# Patient Record
Sex: Female | Born: 1980 | Hispanic: Yes | Marital: Married | Smoking: Never smoker
Health system: Southern US, Community
[De-identification: ages and names within clinical notes are randomized; demographics above are authoritative.]

## PROBLEM LIST (undated history)

## (undated) ENCOUNTER — Inpatient Hospital Stay (HOSPITAL_COMMUNITY): Payer: Self-pay

## (undated) DIAGNOSIS — O1490 Unspecified pre-eclampsia, unspecified trimester: Secondary | ICD-10-CM

## (undated) DIAGNOSIS — F32A Depression, unspecified: Secondary | ICD-10-CM

## (undated) DIAGNOSIS — O159 Eclampsia, unspecified as to time period: Secondary | ICD-10-CM

## (undated) DIAGNOSIS — F419 Anxiety disorder, unspecified: Secondary | ICD-10-CM

## (undated) DIAGNOSIS — F329 Major depressive disorder, single episode, unspecified: Secondary | ICD-10-CM

## (undated) DIAGNOSIS — R569 Unspecified convulsions: Secondary | ICD-10-CM

---

## 2005-02-14 ENCOUNTER — Inpatient Hospital Stay (HOSPITAL_COMMUNITY): Admission: AD | Admit: 2005-02-14 | Discharge: 2005-02-14 | Payer: Self-pay | Admitting: *Deleted

## 2005-02-21 ENCOUNTER — Ambulatory Visit: Payer: Self-pay | Admitting: Family Medicine

## 2005-02-28 ENCOUNTER — Ambulatory Visit: Payer: Self-pay | Admitting: Family Medicine

## 2005-04-01 ENCOUNTER — Ambulatory Visit: Payer: Self-pay | Admitting: Family Medicine

## 2005-04-11 ENCOUNTER — Ambulatory Visit (HOSPITAL_COMMUNITY): Admission: RE | Admit: 2005-04-11 | Discharge: 2005-04-11 | Payer: Self-pay | Admitting: Obstetrics & Gynecology

## 2005-04-30 ENCOUNTER — Ambulatory Visit: Payer: Self-pay | Admitting: Family Medicine

## 2005-06-06 ENCOUNTER — Ambulatory Visit: Payer: Self-pay | Admitting: Family Medicine

## 2005-06-10 ENCOUNTER — Ambulatory Visit: Payer: Self-pay | Admitting: Family Medicine

## 2005-07-09 ENCOUNTER — Ambulatory Visit: Payer: Self-pay | Admitting: Family Medicine

## 2005-07-17 ENCOUNTER — Ambulatory Visit: Payer: Self-pay | Admitting: *Deleted

## 2005-07-17 ENCOUNTER — Inpatient Hospital Stay (HOSPITAL_COMMUNITY): Admission: AD | Admit: 2005-07-17 | Discharge: 2005-07-20 | Payer: Self-pay | Admitting: Obstetrics and Gynecology

## 2005-07-21 ENCOUNTER — Inpatient Hospital Stay (HOSPITAL_COMMUNITY): Admission: AD | Admit: 2005-07-21 | Discharge: 2005-07-21 | Payer: Self-pay | Admitting: Obstetrics & Gynecology

## 2005-07-21 ENCOUNTER — Ambulatory Visit: Payer: Self-pay | Admitting: *Deleted

## 2005-07-23 ENCOUNTER — Inpatient Hospital Stay (HOSPITAL_COMMUNITY): Admission: AD | Admit: 2005-07-23 | Discharge: 2005-07-28 | Payer: Self-pay | Admitting: *Deleted

## 2005-07-23 ENCOUNTER — Ambulatory Visit: Payer: Self-pay | Admitting: Obstetrics and Gynecology

## 2005-07-23 ENCOUNTER — Encounter (INDEPENDENT_AMBULATORY_CARE_PROVIDER_SITE_OTHER): Payer: Self-pay | Admitting: Specialist

## 2005-09-17 ENCOUNTER — Ambulatory Visit: Payer: Self-pay | Admitting: Family Medicine

## 2006-09-17 DIAGNOSIS — G43909 Migraine, unspecified, not intractable, without status migrainosus: Secondary | ICD-10-CM | POA: Insufficient documentation

## 2010-07-22 ENCOUNTER — Inpatient Hospital Stay (HOSPITAL_COMMUNITY)
Admission: AD | Admit: 2010-07-22 | Discharge: 2010-07-22 | Payer: Self-pay | Source: Home / Self Care | Attending: Obstetrics & Gynecology | Admitting: Obstetrics & Gynecology

## 2010-09-06 ENCOUNTER — Inpatient Hospital Stay (HOSPITAL_COMMUNITY)
Admission: AD | Admit: 2010-09-06 | Discharge: 2010-09-06 | Disposition: A | Discharge status: Home / Self Care | Payer: Self-pay | Source: Ambulatory Visit | Attending: Obstetrics & Gynecology | Admitting: Obstetrics & Gynecology

## 2010-09-06 DIAGNOSIS — R109 Unspecified abdominal pain: Secondary | ICD-10-CM | POA: Insufficient documentation

## 2010-09-06 DIAGNOSIS — O200 Threatened abortion: Secondary | ICD-10-CM

## 2010-09-19 DEATH — deceased

## 2010-09-30 LAB — URINE MICROSCOPIC-ADD ON

## 2010-09-30 LAB — URINALYSIS, ROUTINE W REFLEX MICROSCOPIC
Bilirubin Urine: NEGATIVE
Glucose, UA: NEGATIVE mg/dL
Ketones, ur: NEGATIVE mg/dL
Nitrite: POSITIVE — AB
Protein, ur: NEGATIVE mg/dL
Specific Gravity, Urine: 1.01 (ref 1.005–1.030)
Urobilinogen, UA: 0.2 mg/dL (ref 0.0–1.0)
pH: 7 (ref 5.0–8.0)

## 2010-09-30 LAB — GC/CHLAMYDIA PROBE AMP, GENITAL
Chlamydia, DNA Probe: NEGATIVE
GC Probe Amp, Genital: NEGATIVE

## 2010-09-30 LAB — URINE CULTURE
Colony Count: >=100000
Culture  Setup Time: 201201030136

## 2010-09-30 LAB — WET PREP, GENITAL
Trich, Wet Prep: NONE SEEN
Yeast Wet Prep HPF POC: NONE SEEN

## 2010-09-30 LAB — CBC
HCT: 36.3 % (ref 36.0–46.0)
Hemoglobin: 12.9 g/dL (ref 12.0–15.0)
MCH: 31.2 pg (ref 26.0–34.0)
MCHC: 35.5 g/dL (ref 30.0–36.0)
MCV: 87.7 fL (ref 78.0–100.0)
Platelets: 292 10*3/uL (ref 150–400)
RBC: 4.14 MIL/uL (ref 3.87–5.11)
RDW: 12.7 % (ref 11.5–15.5)
WBC: 7.2 10*3/uL (ref 4.0–10.5)

## 2010-09-30 LAB — ABO/RH: ABO/RH(D): O POS

## 2010-09-30 LAB — POCT PREGNANCY, URINE: Preg Test, Ur: POSITIVE

## 2010-12-06 NOTE — Discharge Summary (Signed)
Dorothy Vaughan           ACCOUNT NO.:  [id number]   MEDICAL RECORD NO.:  [id number]          PATIENT TYPE:  INP   LOCATION:  9151                          [facility]ILITY:  [facility]   PHYSICIAN:  Tanya S. Shawnie Vaughan, M.D.   DATE OF BIRTH:  1981-02-27   DATE OF ADMISSION:  07/17/2005  DATE OF DISCHARGE:  07/20/2005                                 DISCHARGE SUMMARY   REASON FOR ADMISSION:  Intrauterine pregnancy at 33-1/7 weeks without  elevated blood pressure, rule out preeclampsia, possible preterm labor and  elevated temperature, and abdominal pain.   DISCHARGE DIAGNOSES:  1.  Intrauterine pregnancy at 33 weeks.  2.  Abdominal pain, resolved.  3.  Elevated liver function tests, resolved.  4.  Oligohydramnios with an AFI of 8.2.  5.  Elevated blood pressures with a normal 24-hour urine (possible evolving      preeclampsia).  6.  Preterm contractions.   IMAGING:  On July 17, 2005, OB ultrasound showed a cephalic infant,  grade 1 placenta, AFI 10.7, estimated fetal weight 1785 (25th to the 50th  percentile).  Abdominal circumference 30 weeks 4 days.  Femur length 33 and  4 days.  Head circumference 30 weeks 4 days.  Bipyramidal diameter 30 weeks  3 days.  Cervix 2.8 cm.  Umbilical artery Dopplers 2.14 (normal less than  3.7).   On December 28, BPP 8 out of 8.  Normal umbilical artery Dopplers.  AFI 8.2  which is the low end of normal.   Gallbladder ultrasound:  A 3 mm gallstone adherent to the wall versus  gallbladder polyp.  No evidence of acute cholecystitis.  Small right pleural  effusion.   LABORATORY DATA:  Admit labs:  AFP 118 which decreased at 39.  ALT 77 which  decreased to 36.  Otherwise PH 5 within normal limits.  A 24-hour urine  showed protein at 168, volume 2100, creatinine clearance 113.  Amylase and  lipase negative.   HOSPITAL COURSE:  The patient is a 30 year old gravida 1, para 0 at 33-1/7  weeks dated by an LMP consistent with one-week ultrasound.  The  patient  presented with contractions since midnight.  She also endorsed a mild  headache but denied visual changes, shortness of breath. She reported  constant abdominal pain.  On initial exam her blood pressures were found to  be elevated, ranging from 153-180/66-92.  Abdomen was diffusely tender to  palpation with no rebound.  She was found to be contracting approximately  every minute.  Sterile vaginal exam revealed that she was dilated to 1, 80,  -3 station.  She was admitted.  Ultrasound was done and showed no signs of  an abruption.  She did receive one dose of terbutaline and was started on  Procardia preterm labor.  __________  were within normal limits except for  elevated LFTs and she also had an elevated LDH at 315.  A 24-hour urine was  started.  Results are as above.  The patient was also given two doses of  betamethasone.  During her hospital course her blood pressures were within  normal limits, except for  some isolated blood pressures that were up to  150/87.  She was not in preterm labor and her tocolytics were stopped.  The  abdominal pain did completely resolve during the hospitalization.  Further  investigation was done with the gallbladder ultrasound which was described  above.  It was decided that the patient was stable for discharge to home.   DISPOSITION:  Home.   FOLLOW UP:  July 21, 2004 for biophysical profile and to return a 24-hour  urine.  She will also get PIH labs drawn that day.  Then she will be  followed in the high-risk clinic on Thursday, July 24, 2004.  Plan is to  continue fetal testing twice weekly with PIH labs weekly.   DISCHARGE MEDICATIONS:  Prenatal vitamins.   DIET:  Regular.   ACTIVITY:  Bed rest.   DISCHARGE INSTRUCTIONS:  Preterm labor precautions, preeclampsia precautions  and __________  counts.  It was explained to the patient.     ______________________________  Dorothy Vaughan, M.D.     ______________________________  Dorothy Vaughan, M.D.    TLW/MEDQ  D:  07/20/2005  T:  07/21/2005  Job:  [id number]

## 2010-12-06 NOTE — Discharge Summary (Signed)
NAMEANNALYSSA, Dorothy Vaughan           ACCOUNT NO.:  [id number]   MEDICAL RECORD NO.:  [id number]          PATIENT TYPE:  INP   LOCATION:  9320                          [facility]:  [facility]   PHYSICIAN:  Lesly Dukes, M.D. DATE OF BIRTH:  1980/08/28   DATE OF ADMISSION:  07/23/2005  DATE OF DISCHARGE:  07/28/2005                                 DISCHARGE SUMMARY   CHIEF COMPLAINT:  Eclamptic seizure with delivery of 34 week infant.   ATTENDING PHYSICIAN:  Dr. Penne Lash.   PRIMARY CARE PHYSICIAN:  Dr. Penni Bombard.   DISCHARGE DIAGNOSES:  1.  Eclampsia.  2.  Posterior reversible encephalopathy syndrome.  3.  Status post lateral transverse C-section of approximately a 83 week female      infant.  4.  Anemia.   PROCEDURES:  MRI revealed bilateral perioccipital cortical and subcortical  hyper intensity, which was mildly positive on diffusion.  This was  consistent with PRES syndrome.  Also, showed enlarged pituitary consistent  with peripartum state and a left tongue laceration likely secondary to  seizure.  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   LABORATORY VALUES:  At time of discharge, hemoglobin 8.8.  Uric acid 4.5,  LDH 277.  AST 98, ALT 67, alkaline phos 210, T-bili 0.3, creatinine 0.6.  Please note those are on the day prior to discharge.   HOSPITAL COURSE:  1.  Eclampsia.  The patient had been followed for one week prior to      admission for preeclampsia.  She presented to the Maternity Admissions      with complaints of scotomata and severe headache.  She could also, at      time of admission, not see below her nose and mouth.  While PIH labs      were pending, the patient had an eclamptic seizure and at that time was      taken immediately for cesarean section.  A lower transverse C-section      was performed and she delivered a viable female at 44 and 5/7 weeks.  At      the time of admission, she was started on magnesium, and continued on  magnesium for over 48 hours postpartum.  Her PIH labs were monitored      including LFT and platelets.  These all remained normal until the two      days prior to discharge where she had a small bump in her LFTs, which      were trending down at the time of discharge.  Her LDH and uric acid were      within the normal range at time of discharge.  She had no further      eclamptic seizures and her magnesium was discharged on postpartum day      #3.  2.  PRES syndrome.  Postpartum, the patient was found to have a visual field      loss in the bilateral lower fields.  She had evaluation by      Ophthalmology, who recommended MRI scan.  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  Over the      remainder days of her admission, her loss of vision resolved and was      back to baseline at time of discharge.  3.  Elevated blood pressure was consistent with eclampsia. She was      maintained on Labetalol throughout her admission, and at time of      discharge was requiring only minimal amounts of Labetalol, and therefore      was discharged on 12.5 of metoprolol daily.  She will be followed up in      two week's time in the office, and likely will no longer need this      medication.  4.  Breastfeeding.  The patient started pumping during admission and has      been doing well producing milk for her child.  5.  Viable female infant, has been well in the NICU despite one mishap with      placing an NG tube, in which the NG tube coiled in the nasopharynx, and      went up into the eustachian tubes into the ossicles of the ears.  This      will need to be followed two weeks post discharge.  6.  Contraception.  The patient will be considering possible IUD placement,      but in the meantime will continue using rhythm method.   CONSULTS:  Dr. Sandria Manly with Neurology, and  Ophthalmology.   DISCHARGE MEDICATIONS:  1.  Percocet 5/325 one to two tabs p.o. q.4 to 6 h. p.r.n. #30.  2.  Ibuprofen 800 mg one tab p.o. q.8 h. p.r.n. #30.  3.  Prenatal vitamins.      Penni Bombard, MD    ______________________________  Lesly Dukes, M.D.    SJ/MEDQ  D:  07/28/2005  T:  07/28/2005  Job:  [id number]   cc:   Penni Bombard, MD  Fax: [phone]

## 2010-12-06 NOTE — Consult Note (Signed)
NAMEMATSUE, STROM           ACCOUNT NO.:  [id number]   MEDICAL RECORD NO.:  [id number]          PATIENT TYPE:  INP   LOCATION:  9374                          [facility]   PHYSICIAN:  Genene Churn. Love, M.D.    DATE OF BIRTH:  1980/11/18   DATE OF CONSULTATION:  07/23/2005  DATE OF DISCHARGE:                                   CONSULTATION   HISTORY OF PRESENT ILLNESS:  This 30 year old right-handed [place] female  who is in her 34th week of gestation is seen on an emergency basis at the  request of Penni Bombard, M.D. for evaluation of headache, seizure, and  visual loss.   Ms. Zeiter has no known prior history of hypertension.  She is currently  [redacted] weeks pregnant and yesterday developed headache with blurred vision  characterized by dots in her vision and today had a seizure.  This was  followed by cesarean section.  She was eclamptic.  She has had headaches and  visual disturbance today.  She was seen by ophthalmology who noted bilateral  lower visual field cuts and recommended neurologic evaluation.  She has  received magnesium with magnesium levels in the 6 range and labetalol.  Blood pressure has been as high as 105 diastolic.   PHYSICAL EXAMINATION:  GENERAL:  Well-developed female in no acute distress.  She was not complaining of headache.  VITAL SIGNS:  Blood pressure right arm was 120/80, left arm 140/80, heart  rate 64.  There were no bruits.  MENTAL STATUS:  She was alert and oriented x3.  Her cranial nerve  examination revealed visual fields to be full except for possibly some  difficulty with vision in the lower fields bilaterally, although, this was  not to count finger examination.  She noted obscuration in the lower fields  bilaterally.  She had full extraocular movements, pupils that reacted from 4  to 3 bilaterally, corneals that were present, hearing intact.  Tongue  midline, uvula midline, gag is present.  Motor examination revealed 5/5  strength upper  and lower extremities without full effort given in the lower  extremities.  Sensory examination intact.  Deep tendon reflexes 2+ and  plantar response is downgoing.   IMPRESSION:  1.  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  784.0 and 368.4  2.  Hypertension.  796.2   PLAN:  I agree with MRI study to look for evidence of Press syndrome in the  posterior circulation distribution.           ______________________________  Genene Churn. Sandria Manly, M.D.     JML/MEDQ  D:  07/23/2005  T:  07/24/2005  Job:  [id number]

## 2010-12-06 NOTE — Op Note (Signed)
Dorothy Vaughan, Dorothy Vaughan           ACCOUNT NO.:  [id number]   MEDICAL RECORD NO.:  [id number]          PATIENT TYPE:  INP   LOCATION:  9374                          [facility]   PHYSICIAN:  Conni Elliot, M.D.DATE OF BIRTH:  [birth year]   DATE OF PROCEDURE:  07/23/2005  DATE OF DISCHARGE:                                 OPERATIVE REPORT   PREOPERATIVE DIAGNOSES:  1.  Eclampsia.  2.  Placental abruption.   POSTOPERATIVE DIAGNOSES:  1.  Eclampsia.  2.  Placental abruption.   OPERATION:  STAT low transverse cesarean delivery.   OPERATOR:  Conni Elliot, M.D.   ANESTHESIA:  General endotracheal.   DESCRIPTION OF PROCEDURE:  After placing the patient under general  endotracheal anesthesia, with patient supine, the abdomen was prepped and  draped in a sterile fashion.  An emergent Pfannenstiel incision was made.  The remainder of the procedure, once the fascia was nicked, was using blunt  traction.  The peritoneal cavity was entered and a bladder flap was created.  A low transverse uterine incision was made.  There was free blood visible  upon entering the uterine cavity consistent with the placental abruption.  The baby was delivered in vertex presentation, cord double-clamped and cut  and __________ was perfused down the umbilical cord into the baby due to the  placental abruption prior to clamping the cord.  The baby was handed to the  neonatologist in attendance.  The placenta was delivered spontaneously.  The  uterus, bladder flap, anterior peritoneum, fascia and skin were closed in  routine fashion.  Estimated blood loss was approximately 800 mL without  replacement.  There was no sponge count, therefore an x-ray was taken  following the procedure to identify no retrained instruments or sponges.           ______________________________  Conni Elliot, M.D.     ASG/MEDQ  D:  07/23/2005  T:  07/23/2005  Job:  [id number]

## 2011-01-29 ENCOUNTER — Inpatient Hospital Stay (HOSPITAL_COMMUNITY)
Admission: EM | Admit: 2011-01-29 | Discharge: 2011-01-29 | Disposition: A | Discharge status: Home / Self Care | Payer: Self-pay | Source: Ambulatory Visit | Attending: Obstetrics and Gynecology | Admitting: Obstetrics and Gynecology

## 2011-01-29 ENCOUNTER — Encounter (HOSPITAL_COMMUNITY): Payer: Self-pay | Admitting: Obstetrics and Gynecology

## 2011-01-29 DIAGNOSIS — F329 Major depressive disorder, single episode, unspecified: Secondary | ICD-10-CM

## 2011-01-29 HISTORY — DX: Depression, unspecified: F32.A

## 2011-01-29 HISTORY — DX: Major depressive disorder, single episode, unspecified: F32.9

## 2011-01-29 HISTORY — DX: Anxiety disorder, unspecified: F41.9

## 2011-01-29 MED ORDER — CITALOPRAM HYDROBROMIDE 20 MG PO TABS: 20.0000 mg | ORAL_TABLET | Freq: Every day | ORAL | Status: DC

## 2011-01-29 NOTE — Progress Notes (Signed)
Pt states she had a miscarriage in Feb. Pt states she started feeling depressed after this. Pt states she was seen in [place] and they told her it was a hormonal imbalance and it would go away

## 2011-01-29 NOTE — Progress Notes (Signed)
Pt states," I had a miscarriage Feb 17, and followed up in [facility] for cytotec. He discharged me in may. I keep having depression, and some days I tremble an cry all day. They are treating me for "burning mouth syndrome. I started a normal period on Saturday."

## 2011-01-29 NOTE — ED Provider Notes (Signed)
History   Pt is a G2P1AB1 and presents today c/o depression. She recently had a spontaneous AB and has been feeling very depressed. She states she wakes up in the morning and begins crying. She states that it is uncontrollable and she is worried. She denies homicidal or suicidal ideations.  Chief Complaint  Patient presents with  . Depression   HPI  OB History    Grav Para Term Preterm Abortions TAB SAB Ect Mult Living   2 1 1  1  1   1       Past Medical History  Diagnosis Date  . Anxiety   . Depression     Past Surgical History  Procedure Date  . Cesarean section     History reviewed. No pertinent family history.  History  Substance Use Topics  . Smoking status: Never Smoker   . Smokeless tobacco: Not on file  . Alcohol Use: No    Allergies: Allergies no known allergies  No prescriptions prior to admission    Review of Systems  Constitutional: Negative for fever, chills and malaise/fatigue.  Respiratory: Negative for shortness of breath and wheezing.   Cardiovascular: Negative for chest pain and palpitations.  Gastrointestinal: Negative for nausea, vomiting and abdominal pain.  Genitourinary: Negative for dysuria, urgency and frequency.  Neurological: Negative for dizziness and headaches.  Psychiatric/Behavioral: Positive for depression. Negative for suicidal ideas, hallucinations and substance abuse. The patient is nervous/anxious.    Physical Exam   Blood pressure 128/85, pulse 72, temperature 99.4 F (37.4 C), temperature source Oral, resp. rate 20, height 5\' 1"  (1.549 m), weight 105 lb 4 oz (47.741 kg), last menstrual period 01/25/2011.  Physical Exam  Constitutional: She is oriented to person, place, and time. She appears well-developed and well-nourished.  HENT:  Head: Normocephalic and atraumatic.  Eyes: Pupils are equal, round, and reactive to light.  Cardiovascular: Normal rate and regular rhythm.  Exam reveals no gallop and no friction rub.   No  murmur heard. Respiratory: No respiratory distress. She has no wheezes. She has no rales. She exhibits no tenderness.  GI: She exhibits no distension. There is no tenderness. There is no rebound and no guarding.  Neurological: She is alert and oriented to person, place, and time.  Skin: Skin is warm and dry.  Psychiatric: Her speech is normal. Judgment and thought content normal. Cognition and memory are normal. She exhibits a depressed mood.    MAU Course  Procedures   A/P: 1) Depression: discussed with pt via interpreter. Will begin Celexa 20mg  po qd and will have pt f/u in the GYN clinic in 2wks. Discussed diet, activity, risks, and precautions.  Henrietta Hoover, [place] 01/29/11 1306

## 2011-01-30 ENCOUNTER — Emergency Department (HOSPITAL_COMMUNITY)
Admission: EM | Admit: 2011-01-30 | Discharge: 2011-01-30 | Disposition: A | Discharge status: Home / Self Care | Payer: Self-pay | Attending: Emergency Medicine | Admitting: Emergency Medicine

## 2011-01-30 DIAGNOSIS — M542 Cervicalgia: Secondary | ICD-10-CM | POA: Insufficient documentation

## 2011-01-30 DIAGNOSIS — G4440 Drug-induced headache, not elsewhere classified, not intractable: Secondary | ICD-10-CM | POA: Insufficient documentation

## 2011-01-30 DIAGNOSIS — T43205A Adverse effect of unspecified antidepressants, initial encounter: Secondary | ICD-10-CM | POA: Insufficient documentation

## 2011-02-13 ENCOUNTER — Ambulatory Visit: Payer: Self-pay | Admitting: Family Medicine

## 2012-04-19 ENCOUNTER — Ambulatory Visit: Payer: Self-pay | Admitting: Gynecology

## 2012-05-31 ENCOUNTER — Inpatient Hospital Stay (HOSPITAL_COMMUNITY)
Admission: AD | Admit: 2012-05-31 | Discharge: 2012-05-31 | Disposition: A | Discharge status: Home / Self Care | Payer: Self-pay | Source: Ambulatory Visit | Attending: Obstetrics and Gynecology | Admitting: Obstetrics and Gynecology

## 2012-05-31 ENCOUNTER — Encounter (HOSPITAL_COMMUNITY): Payer: Self-pay

## 2012-05-31 DIAGNOSIS — R238 Other skin changes: Secondary | ICD-10-CM

## 2012-05-31 DIAGNOSIS — L988 Other specified disorders of the skin and subcutaneous tissue: Secondary | ICD-10-CM

## 2012-05-31 DIAGNOSIS — N909 Noninflammatory disorder of vulva and perineum, unspecified: Secondary | ICD-10-CM | POA: Insufficient documentation

## 2012-05-31 NOTE — MAU Provider Note (Signed)
  History     CSN: [id number]  Arrival date and time: 05/31/12 1248   First Provider Initiated Contact with Patient 05/31/12 1334      Chief Complaint  Patient presents with  . Bump on vagina    HPI  Patient states that she was seeking treatment at [facility] for a yeast infection when a small pimple-like lesion was discovered on her labia.  The lesion has been present for approximately 2 weeks.  She states no burning, pain, itching, dysuria, or discharge.  Patient reports being tested for STIs at [facility], and all tests were negative.  She also reports that her last sexual encounter was 2 months ago.  Patient has completed treatment for yeast infection, and reports that it has cleared, although this lesion has remained.   Past Medical History  Diagnosis Date  . Anxiety   . Depression     Past Surgical History  Procedure Date  . Cesarean section     Family History  Problem Relation Age of Onset  . Other Neg Hx     History  Substance Use Topics  . Smoking status: Never Smoker   . Smokeless tobacco: Never Used  . Alcohol Use: No    Allergies: No Known Allergies  No prescriptions prior to admission    Review of Systems  Constitutional: Negative.   Genitourinary: Negative.   Skin: Negative.    Physical Exam   Last menstrual period 05/19/2012.  Physical Exam  Constitutional: She appears well-developed and well-nourished.  Genitourinary: No vaginal discharge found.  Skin: Skin is warm and dry.  Psychiatric: She has a normal mood and affect. Her behavior is normal. Judgment and thought content normal.   Small papule consistent with presentation of an ingrown hair present on upper left labial fold  MAU Course  Procedures  MDM 1) Physical exam to assess lesion  Assessment and Plan   31 yo female presenting with a small papule on upper left labial fold.  No erythema or discharge present.  No pathological conditions observed.  Instruct  patient to monitor lesion for any changes.  If symptoms worsen or erythema and discharge present, patient instructed to return for follow up.  Nira Conn 05/31/2012, 1:34 PM   I saw and examined patient along with student and agree with above note.   FRAZIER,NATALIE 06/01/2012 2:05 PM

## 2012-05-31 NOTE — MAU Note (Signed)
Pt states via Eda, Spanish translator that she has a bump on middle portion of her left labia. Had appt with her gynocologist who was not concerned with it and [facility]. Denies itching or burning. Has vaginal d/c from yeast, took one dose 2 weeks ago.

## 2012-06-02 NOTE — MAU Provider Note (Signed)
Attestation of Attending Supervision of Advanced Practitioner (CNM/NP): Evaluation and management procedures were performed by the Advanced Practitioner under my supervision and collaboration.  I have reviewed the Advanced Practitioner's note and chart, and I agree with the management and plan.  Pranathi Winfree 06/02/2012 10:50 AM   

## 2012-09-04 IMAGING — US US OB TRANSVAGINAL MODIFY
1 series · 14 of 28 positions shown · non-contrast
Comparison: none

[Series 1: us ob comp less 14 wks · 65 acquisitions, 14 frames shown]
[im 3/65]
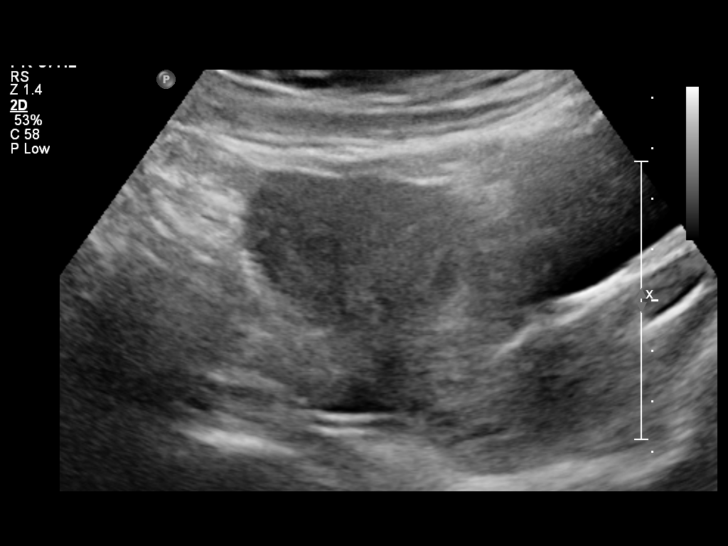
[im 8/65]
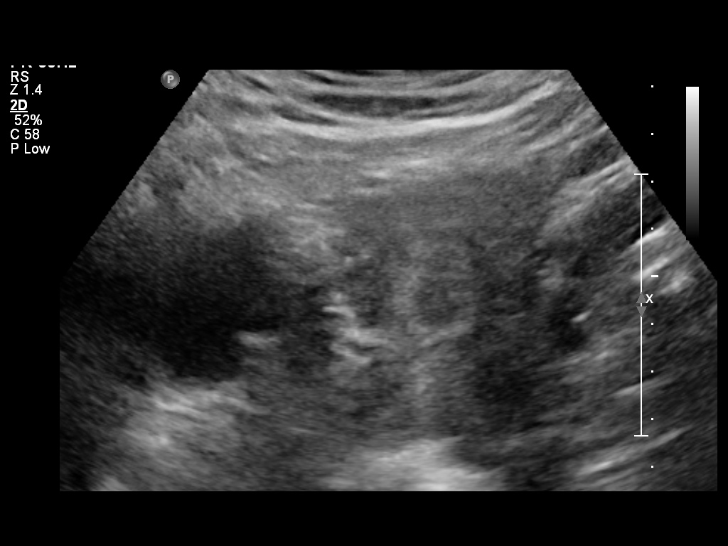
[im 12/65]
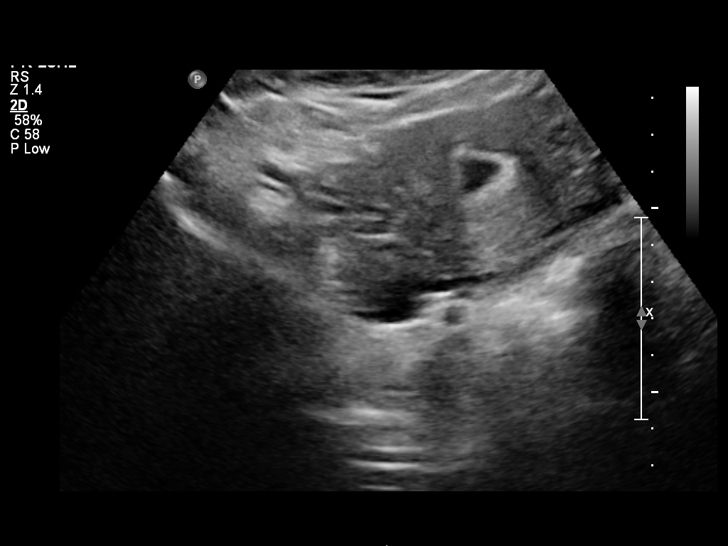
[im 17/65]
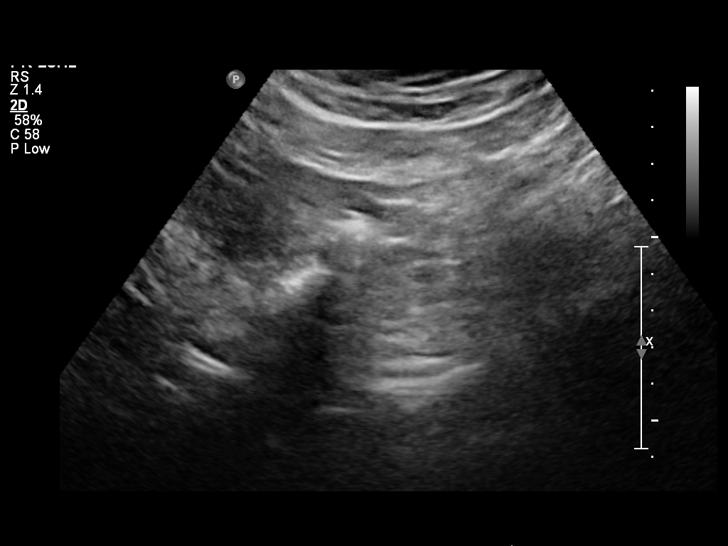
[im 22/65]
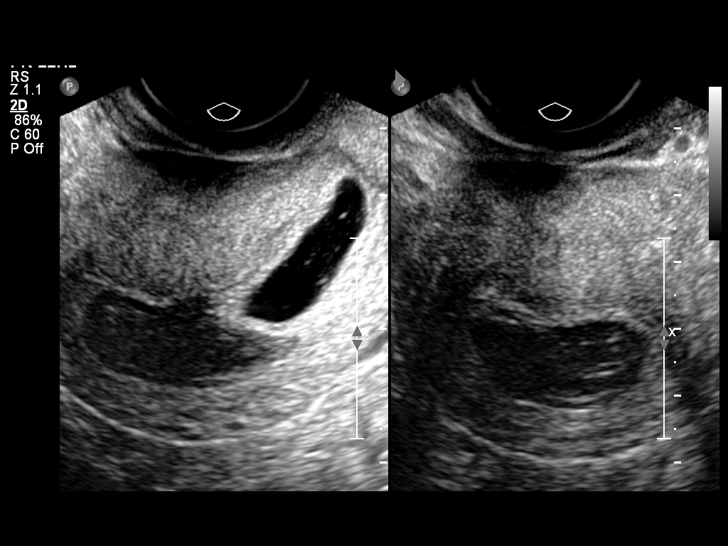
[im 27/65]
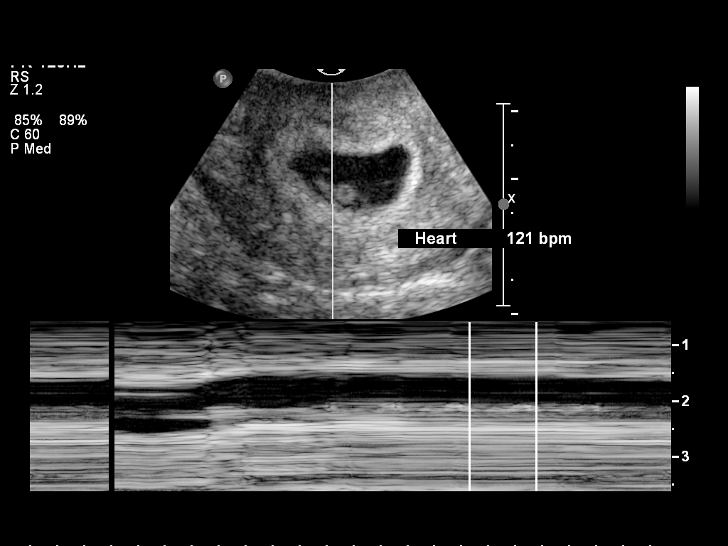
[im 31/65]
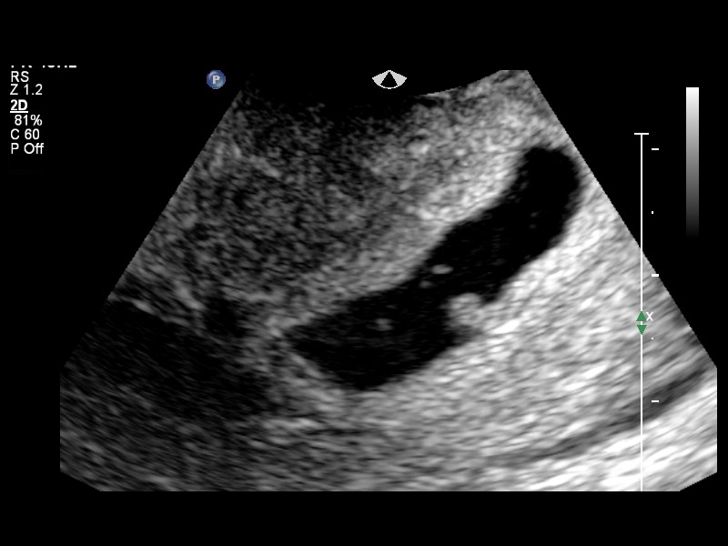
[im 36/65]
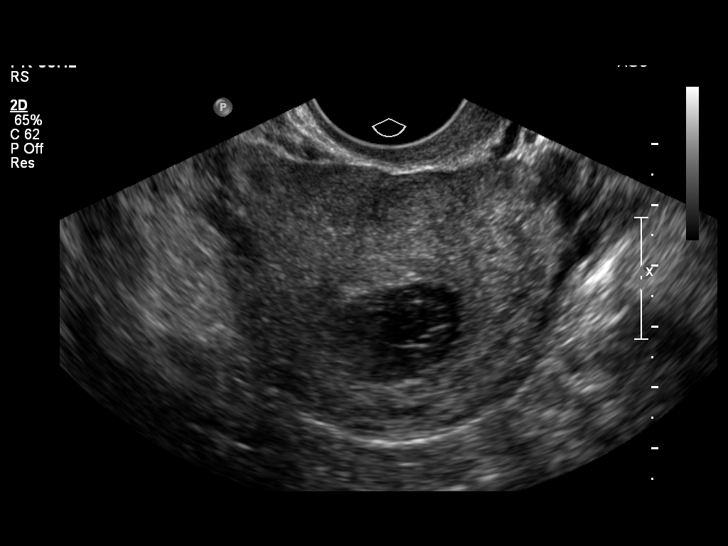
[im 41/65]
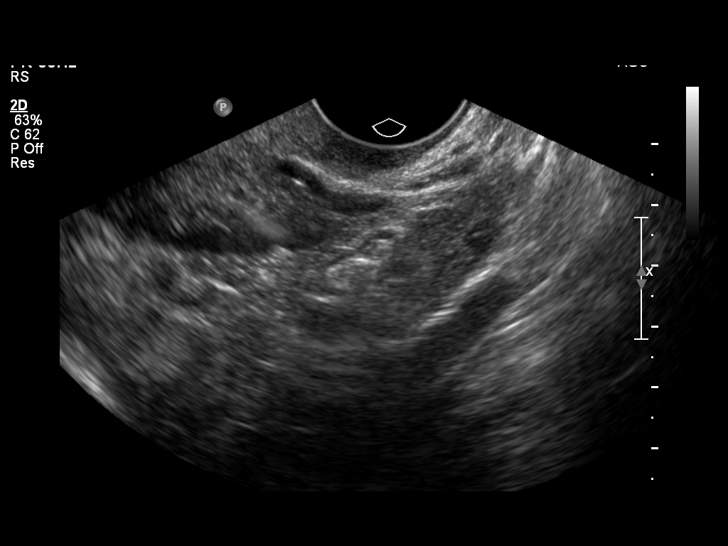
[im 46/65]
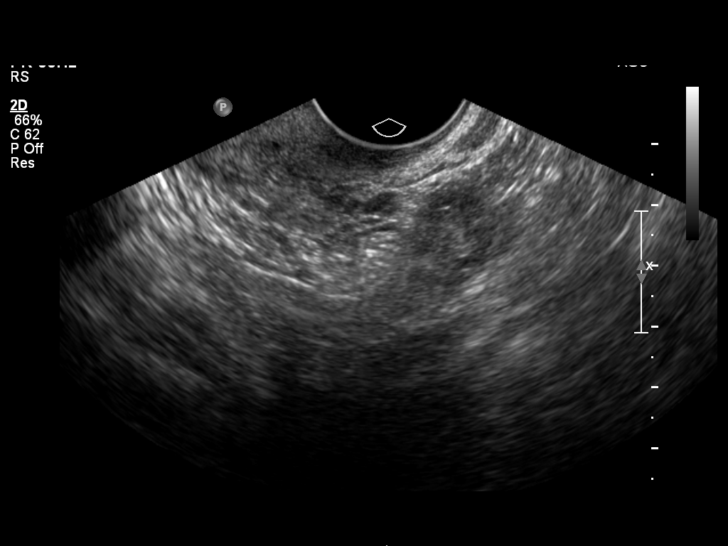
[im 50/65]
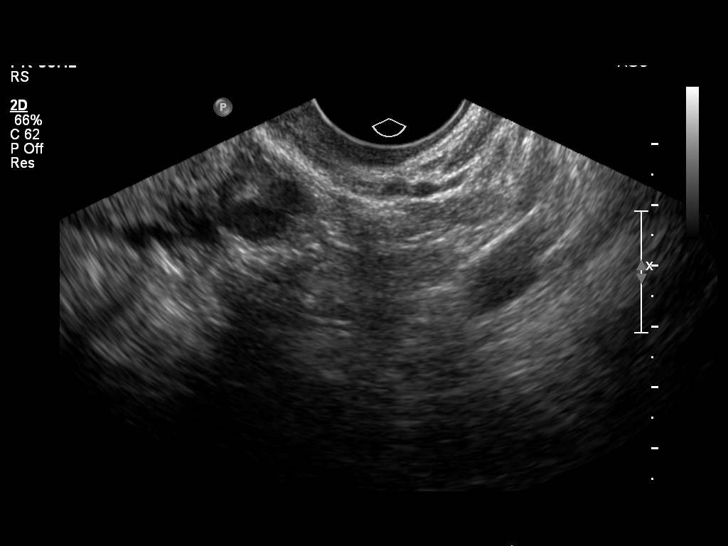
[im 55/65]
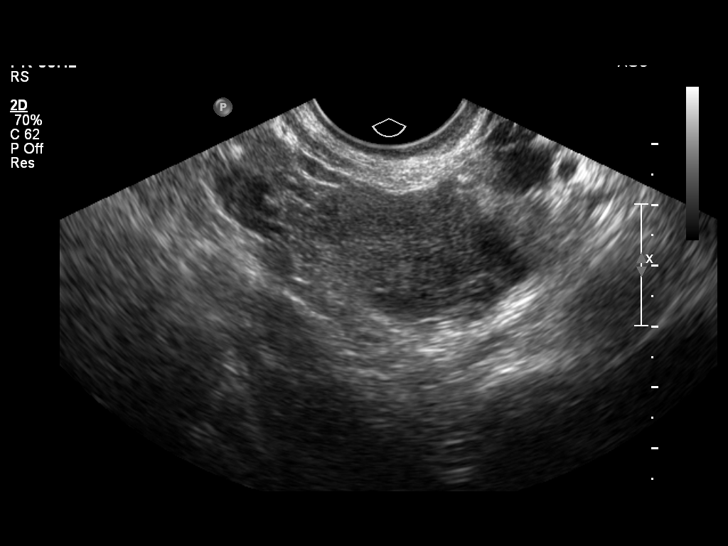
[im 60/65]
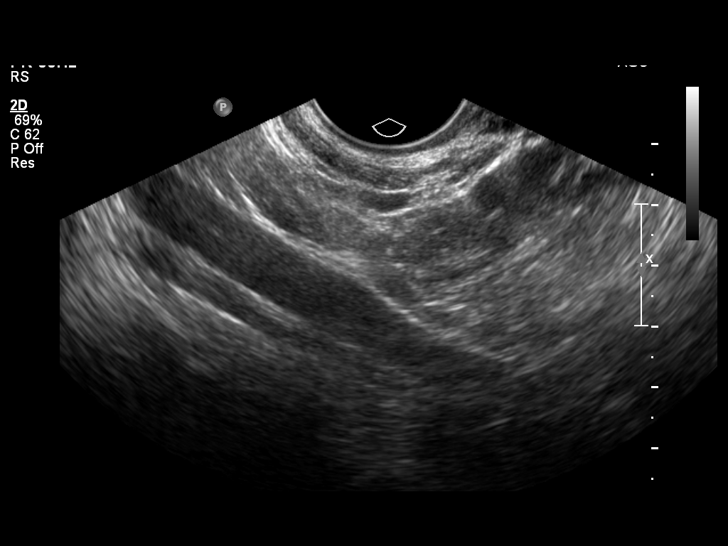
[im 65/65]
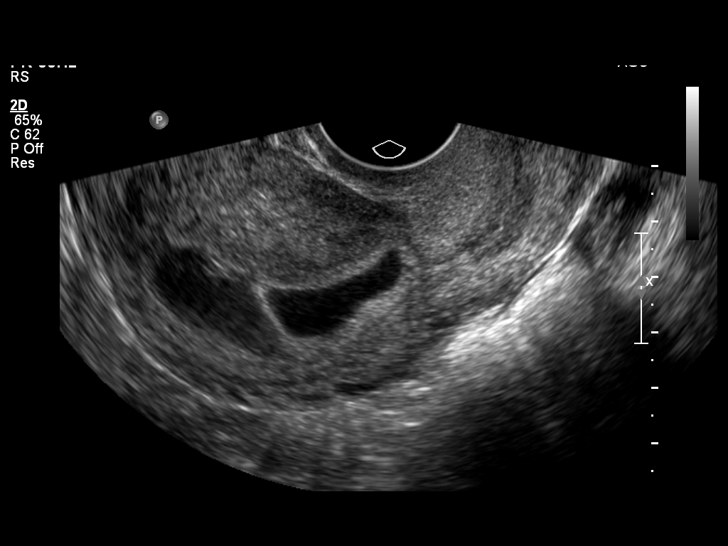

[14 of 28 positions shown; findings below may reference images not displayed]

Canned report from images found in remote index.

Refer to host system for actual result text.

## 2013-01-11 ENCOUNTER — Encounter (HOSPITAL_COMMUNITY): Payer: Self-pay | Admitting: Emergency Medicine

## 2013-01-11 ENCOUNTER — Emergency Department (HOSPITAL_COMMUNITY)
Admission: EM | Admit: 2013-01-11 | Discharge: 2013-01-12 | Disposition: A | Discharge status: Home / Self Care | Payer: Self-pay | Attending: Emergency Medicine | Admitting: Emergency Medicine

## 2013-01-11 DIAGNOSIS — Z8659 Personal history of other mental and behavioral disorders: Secondary | ICD-10-CM | POA: Insufficient documentation

## 2013-01-11 DIAGNOSIS — Z3202 Encounter for pregnancy test, result negative: Secondary | ICD-10-CM | POA: Insufficient documentation

## 2013-01-11 DIAGNOSIS — R109 Unspecified abdominal pain: Secondary | ICD-10-CM | POA: Insufficient documentation

## 2013-01-11 DIAGNOSIS — N390 Urinary tract infection, site not specified: Secondary | ICD-10-CM | POA: Insufficient documentation

## 2013-01-11 LAB — URINALYSIS, ROUTINE W REFLEX MICROSCOPIC
Glucose, UA: NEGATIVE mg/dL
Ketones, ur: NEGATIVE mg/dL
Protein, ur: NEGATIVE mg/dL

## 2013-01-11 LAB — URINE MICROSCOPIC-ADD ON

## 2013-01-11 LAB — POCT PREGNANCY, URINE: Preg Test, Ur: NEGATIVE

## 2013-01-11 MED ORDER — PROMETHAZINE HCL 25 MG PO TABS: 25.0000 mg | ORAL_TABLET | Freq: Four times a day (QID) | ORAL | Status: DC | PRN

## 2013-01-11 MED ORDER — PHENAZOPYRIDINE HCL 200 MG PO TABS: 200.0000 mg | ORAL_TABLET | Freq: Three times a day (TID) | ORAL | Status: DC

## 2013-01-11 MED ORDER — CEPHALEXIN 500 MG PO CAPS: 1000.0000 mg | ORAL_CAPSULE | Freq: Two times a day (BID) | ORAL | Status: DC

## 2013-01-11 NOTE — ED Provider Notes (Signed)
History    CSN: [id number] Arrival date & time 01/11/13  1959  First MD Initiated Contact with Patient 01/11/13 2240     Chief Complaint  Patient presents with  . Dysuria   (Consider location/radiation/quality/duration/timing/severity/associated sxs/prior Treatment) HPI History provided by pt through interpreter.  Pt has had suprapubic pain since this morning.  Associated w/ dysuria, mild, diffuse low back pain and mild nausea.  Denies fever, change in bowels and other GU sx.  Has not taken anything for sx.  No PMH.  Past abd surgeries include c-section. Past Medical History  Diagnosis Date  . Anxiety   . Depression    Past Surgical History  Procedure Laterality Date  . Cesarean section    . Cesarean section     Family History  Problem Relation Age of Onset  . Other Neg Hx    History  Substance Use Topics  . Smoking status: Never Smoker   . Smokeless tobacco: Never Used  . Alcohol Use: No   OB History   Grav Para Term Preterm Abortions TAB SAB Ect Mult Living   2 1 1  1  1   1      Review of Systems  All other systems reviewed and are negative.    Allergies  Review of patient's allergies indicates no known allergies.  Home Medications   Current Outpatient Rx  Name  Route  Sig  Dispense  Refill  . cephALEXin (KEFLEX) 500 MG capsule   Oral   Take 2 capsules (1,000 mg total) by mouth 2 (two) times daily.   28 capsule   0   . phenazopyridine (PYRIDIUM) 200 MG tablet   Oral   Take 1 tablet (200 mg total) by mouth 3 (three) times daily.   6 tablet   0   . promethazine (PHENERGAN) 25 MG tablet   Oral   Take 1 tablet (25 mg total) by mouth every 6 (six) hours as needed for nausea.   20 tablet   0    BP 140/75  Pulse 57  Temp(Src) 98.7 F (37.1 C) (Oral)  Resp 14  SpO2 100%  LMP 12/25/2012 Physical Exam  Nursing note and vitals reviewed. Constitutional: She is oriented to person, place, and time. She appears well-developed and well-nourished. No  distress.  HENT:  Head: Normocephalic and atraumatic.  Eyes:  Normal appearance  Neck: Normal range of motion.  Cardiovascular: Normal rate and regular rhythm.   Pulmonary/Chest: Effort normal and breath sounds normal. No respiratory distress.  Abdominal: Soft. Bowel sounds are normal. She exhibits no distension and no mass. There is no rebound and no guarding.  Mild suprapubic ttp  Genitourinary:  No CVA tenderness  Musculoskeletal: Normal range of motion.  Neurological: She is alert and oriented to person, place, and time.  Skin: Skin is warm and dry. No rash noted.  Psychiatric: She has a normal mood and affect. Her behavior is normal.    ED Course  Procedures (including critical care time) Labs Reviewed  URINALYSIS, ROUTINE W REFLEX MICROSCOPIC - Abnormal; Notable for the following:    APPearance CLOUDY (*)    Hgb urine dipstick LARGE (*)    Nitrite POSITIVE (*)    Leukocytes, UA MODERATE (*)    All other components within normal limits  URINE MICROSCOPIC-ADD ON - Abnormal; Notable for the following:    Squamous Epithelial / LPF FEW (*)    Bacteria, UA MANY (*)    All other components within normal limits  URINE CULTURE  POCT PREGNANCY, URINE   No results found. 1. UTI (lower urinary tract infection)     MDM  32yo healthy F presents w/ suprapubic pain and dysuria.  U/A positive for infection.  Low clinical suspicion for pyelo.  Prescribed keflex, pyridium and promethazine.  Recommended ibuprofen as well. Return precautions discussed. 11:37 PM   Otilio Miu, PA-C 01/11/13 2337

## 2013-01-11 NOTE — ED Notes (Signed)
PT. REPORTS DYSURIA WITH LOW ABDOMINAL CRAMPING / CONCENTRATED URINE ONSET TODAY , DENIES FEVER.

## 2013-01-12 NOTE — ED Provider Notes (Signed)
Medical screening examination/treatment/procedure(s) were performed by non-physician practitioner and as supervising physician I was immediately available for consultation/collaboration.   Dione Booze, MD 01/12/13 0630

## 2013-01-12 NOTE — ED Notes (Signed)
PT ambulated with baseline gait; VSS; A&Ox3; no signs of distress; respirations even and unlabored; skin warm and dry; no questions upon discharge.  

## 2013-01-13 LAB — URINE CULTURE

## 2013-01-14 NOTE — ED Notes (Signed)
Post ED Visit - Positive Culture Follow-up  Culture report reviewed by antimicrobial stewardship pharmacist: []  Wes Dulaney, Pharm.D., BCPS [x]  Celedonio Miyamoto, Pharm.D., BCPS []  Georgina Pillion, Pharm.D., BCPS []  [place], [place].D., BCPS, AAHIVP []  Estella Husk, Pharm.D., BCPS, AAHIVP  Positive urine culture Treated with Cephalexin, organism sensitive to the same and no further patient follow-up is required at this time.  Larena Sox 01/14/2013, 2:19 PM

## 2013-01-16 ENCOUNTER — Inpatient Hospital Stay (HOSPITAL_COMMUNITY)
Admission: AD | Admit: 2013-01-16 | Discharge: 2013-01-16 | Disposition: A | Discharge status: Home / Self Care | Payer: Self-pay | Source: Ambulatory Visit | Attending: Obstetrics & Gynecology | Admitting: Obstetrics & Gynecology

## 2013-01-16 ENCOUNTER — Encounter (HOSPITAL_COMMUNITY): Payer: Self-pay | Admitting: Family

## 2013-01-16 DIAGNOSIS — R300 Dysuria: Secondary | ICD-10-CM | POA: Insufficient documentation

## 2013-01-16 DIAGNOSIS — R109 Unspecified abdominal pain: Secondary | ICD-10-CM | POA: Insufficient documentation

## 2013-01-16 DIAGNOSIS — R3129 Other microscopic hematuria: Secondary | ICD-10-CM

## 2013-01-16 DIAGNOSIS — R319 Hematuria, unspecified: Secondary | ICD-10-CM | POA: Insufficient documentation

## 2013-01-16 DIAGNOSIS — N390 Urinary tract infection, site not specified: Secondary | ICD-10-CM | POA: Insufficient documentation

## 2013-01-16 HISTORY — DX: Eclampsia, unspecified as to time period: O15.9

## 2013-01-16 HISTORY — DX: Unspecified pre-eclampsia, unspecified trimester: O14.90

## 2013-01-16 HISTORY — DX: Unspecified convulsions: R56.9

## 2013-01-16 LAB — WET PREP, GENITAL
Clue Cells Wet Prep HPF POC: NONE SEEN
Trich, Wet Prep: NONE SEEN
Yeast Wet Prep HPF POC: NONE SEEN

## 2013-01-16 LAB — URINALYSIS, ROUTINE W REFLEX MICROSCOPIC
Bilirubin Urine: NEGATIVE
Leukocytes, UA: NEGATIVE
Nitrite: NEGATIVE
Specific Gravity, Urine: 1.02 (ref 1.005–1.030)
pH: 6.5 (ref 5.0–8.0)

## 2013-01-16 LAB — URINE MICROSCOPIC-ADD ON

## 2013-01-16 MED ORDER — PHENAZOPYRIDINE HCL 200 MG PO TABS: 200.0000 mg | ORAL_TABLET | Freq: Three times a day (TID) | ORAL | Status: DC

## 2013-01-16 NOTE — MAU Note (Signed)
Pt presents with complaints of pain and burning with urination, she was evaluated at [facility] on Wednesday and is being treated for a UTI, but states the medications aren't working.

## 2013-01-16 NOTE — MAU Provider Note (Signed)
History     CSN: [id number]  Arrival date and time: 01/16/13 1512   First Provider Initiated Contact with Patient 01/16/13 1544      Chief Complaint  Patient presents with  . Urinary Tract Infection   HPI Dorothy Vaughan is a 32 y.o. G2P1011. She started having low abd cramping and pain with voiding on 6/25.She went to [facility] ED, was dx with UTI and tx with Keflex and Pyridium. C&S was >100,000 E coli, sensitive to Keflex.The pt started her meds 6/26 am, has taken on time, finished the Pyridium and the pain with voiding is returning. She c/o constant low abd cramping. She denies frequency and urgency.  No change in discharge, odor or itching. Has loose stools from the Keflex, no other GI changes. One partner x 10+ yr, no hx HSV.   OB History   Grav Para Term Preterm Abortions TAB SAB Ect Mult Living   2 1 1  1  1   1       Past Medical History  Diagnosis Date  . Anxiety   . Depression   . Preeclampsia   . Seizures     with pregnancy  . Eclampsia     Past Surgical History  Procedure Laterality Date  . Cesarean section    . Cesarean section      Family History  Problem Relation Age of Onset  . Other Neg Hx     History  Substance Use Topics  . Smoking status: Never Smoker   . Smokeless tobacco: Never Used  . Alcohol Use: No    Allergies: No Known Allergies  Prescriptions prior to admission  Medication Sig Dispense Refill  . cephALEXin (KEFLEX) 500 MG capsule Take 2 capsules (1,000 mg total) by mouth 2 (two) times daily.  28 capsule  0  . phenazopyridine (PYRIDIUM) 200 MG tablet Take 1 tablet (200 mg total) by mouth 3 (three) times daily.  6 tablet  0  . promethazine (PHENERGAN) 25 MG tablet Take 1 tablet (25 mg total) by mouth every 6 (six) hours as needed for nausea.  20 tablet  0    Review of Systems  Constitutional: Negative for fever and chills.  Gastrointestinal: Positive for abdominal pain and diarrhea. Negative for nausea, vomiting, constipation and  blood in stool.  Genitourinary: Positive for dysuria. Negative for urgency, frequency, hematuria and flank pain.   Physical Exam   Blood pressure 105/74, pulse 67, temperature 98.1 F (36.7 C), temperature source Oral, resp. rate 18, last menstrual period 12/25/2012.  Physical Exam  Constitutional: She is oriented to person, place, and time. She appears well-developed and well-nourished.  GI: Soft. There is no tenderness. There is no rebound and no guarding.  Genitourinary:  Neg CVA tenderness Pelvic exam- Ext genitalia- nl anatomy, skin intact Vagina- small amt thick white discharge Cx- parous Uterus- non tender, nl size Adn- no masses palp, nontender  Musculoskeletal: Normal range of motion.  Neurological: She is alert and oriented to person, place, and time.  Skin: Skin is warm and dry.  Psychiatric: She has a normal mood and affect. Her behavior is normal.    MAU Course  Procedures  MDM Results for orders placed during the hospital encounter of 01/16/13 (from the past 24 hour(s))  URINALYSIS, ROUTINE W REFLEX MICROSCOPIC     Status: Abnormal   Collection Time    01/16/13  3:25 PM      Result Value Range   Color, Urine YELLOW  YELLOW   APPearance  CLEAR  CLEAR   Specific Gravity, Urine 1.020  1.005 - 1.030   pH 6.5  5.0 - 8.0   Glucose, UA NEGATIVE  NEGATIVE mg/dL   Hgb urine dipstick LARGE (*) NEGATIVE   Bilirubin Urine NEGATIVE  NEGATIVE   Ketones, ur NEGATIVE  NEGATIVE mg/dL   Protein, ur NEGATIVE  NEGATIVE mg/dL   Urobilinogen, UA 2.0 (*) 0.0 - 1.0 mg/dL   Nitrite NEGATIVE  NEGATIVE   Leukocytes, UA NEGATIVE  NEGATIVE  URINE MICROSCOPIC-ADD ON     Status: Abnormal   Collection Time    01/16/13  3:25 PM      Result Value Range   Squamous Epithelial / LPF FEW (*) RARE   WBC, UA 0-2  <3 WBC/hpf   RBC / HPF 7-10  <3 RBC/hpf   Urine-Other MUCOUS PRESENT    WET PREP, GENITAL     Status: Abnormal   Collection Time    01/16/13  4:00 PM      Result Value Range    Yeast Wet Prep HPF POC NONE SEEN  NONE SEEN   Trich, Wet Prep NONE SEEN  NONE SEEN   Clue Cells Wet Prep HPF POC NONE SEEN  NONE SEEN   WBC, Wet Prep HPF POC FEW (*) NONE SEEN     Assessment and Plan  ASSESSMENT:  Continued dysuria and hematuria with appropriat treatment  PLAN:  Continue Keflex 1 gm bid x 7 d  Pyridium 200 tid x 2 additional days Increase fluids, decrease dark colored fluids If symptoms continue to return for further evaluation.  Alyene Predmore, Olegario Messier M. 01/16/2013, 4:06 PM

## 2013-01-16 NOTE — MAU Note (Signed)
Patient presents to MAU with c/o urinary burning. Reports she was treated for UTI; ran out of pyridium and says pain persists.

## 2013-01-17 LAB — GC/CHLAMYDIA PROBE AMP
CT Probe RNA: NEGATIVE
GC Probe RNA: NEGATIVE

## 2013-01-19 ENCOUNTER — Inpatient Hospital Stay (HOSPITAL_COMMUNITY)
Admission: AD | Admit: 2013-01-19 | Discharge: 2013-01-19 | Disposition: A | Discharge status: Home / Self Care | Payer: Self-pay | Source: Ambulatory Visit | Attending: Obstetrics and Gynecology | Admitting: Obstetrics and Gynecology

## 2013-01-19 ENCOUNTER — Encounter (HOSPITAL_COMMUNITY): Payer: Self-pay | Admitting: *Deleted

## 2013-01-19 DIAGNOSIS — B373 Candidiasis of vulva and vagina: Secondary | ICD-10-CM

## 2013-01-19 DIAGNOSIS — R109 Unspecified abdominal pain: Secondary | ICD-10-CM | POA: Insufficient documentation

## 2013-01-19 DIAGNOSIS — B3731 Acute candidiasis of vulva and vagina: Secondary | ICD-10-CM | POA: Insufficient documentation

## 2013-01-19 LAB — URINE MICROSCOPIC-ADD ON

## 2013-01-19 LAB — URINALYSIS, ROUTINE W REFLEX MICROSCOPIC
Leukocytes, UA: NEGATIVE
Nitrite: NEGATIVE
Specific Gravity, Urine: >1.03 — ABNORMAL HIGH (ref 1.005–1.030)
Urobilinogen, UA: 0.2 mg/dL (ref 0.0–1.0)
pH: 5.5 (ref 5.0–8.0)

## 2013-01-19 MED ORDER — FLUCONAZOLE 150 MG PO TABS
150.0000 mg | ORAL_TABLET | Freq: Once | ORAL | Status: AC
  Administered 2013-01-19: 150 mg via ORAL
  Filled 2013-01-19: qty 1

## 2013-01-19 MED ORDER — NYSTATIN 100000 UNIT/GM EX CREA: TOPICAL_CREAM | CUTANEOUS | Status: DC

## 2013-01-19 NOTE — MAU Provider Note (Signed)
History     CSN: [id number]  Arrival date and time: 01/19/13 2056   First Provider Initiated Contact with Patient 01/19/13 2131      Chief Complaint  Patient presents with  . Urinary Tract Infection   HPI Ms. Dorothy Vaughan is a 32 y.o. G2P1011 who presents to MAU today with complaint of ongoing UTI symptoms. The patient was recently treated with Keflex and completed the course yesterday. She continues to have mild lower abdominal cramping and burning with urination. She denies fever, urgency, frequency, flank pain, hematuria or rash or irritation of the external genitalia.   OB History   Grav Para Term Preterm Abortions TAB SAB Ect Mult Living   2 1 1  1  1   1       Past Medical History  Diagnosis Date  . Anxiety   . Depression   . Preeclampsia   . Seizures     with pregnancy  . Eclampsia     Past Surgical History  Procedure Laterality Date  . Cesarean section    . Cesarean section      Family History  Problem Relation Age of Onset  . Other Neg Hx     History  Substance Use Topics  . Smoking status: Never Smoker   . Smokeless tobacco: Never Used  . Alcohol Use: No    Allergies: No Known Allergies  Prescriptions prior to admission  Medication Sig Dispense Refill  . cephALEXin (KEFLEX) 500 MG capsule Take 2 capsules (1,000 mg total) by mouth 2 (two) times daily.  28 capsule  0  . phenazopyridine (PYRIDIUM) 200 MG tablet Take 1 tablet (200 mg total) by mouth 3 (three) times daily.  6 tablet  0  . promethazine (PHENERGAN) 25 MG tablet Take 1 tablet (25 mg total) by mouth every 6 (six) hours as needed for nausea.  20 tablet  0    Review of Systems  Constitutional: Negative for fever and malaise/fatigue.  Gastrointestinal: Positive for abdominal pain. Negative for nausea and vomiting.  Genitourinary: Positive for dysuria. Negative for urgency, frequency, hematuria and flank pain.       Neg - vaginal bleeding, discharge   Physical Exam   Blood pressure  126/55, pulse 67, temperature 98 F (36.7 C), temperature source Oral, resp. rate 16, height 4\' 10"  (1.473 m), weight 97 lb 3 oz (44.084 kg), last menstrual period 12/25/2012.  Physical Exam  Constitutional: She is oriented to person, place, and time. She appears well-developed and well-nourished. No distress.  HENT:  Head: Normocephalic and atraumatic.  Cardiovascular: Normal rate, regular rhythm and normal heart sounds.   Respiratory: Effort normal and breath sounds normal. No respiratory distress.  GI: Soft. Bowel sounds are normal. She exhibits no distension and no mass. There is no tenderness. There is no rebound and no guarding.  Genitourinary: There is rash (very mild erythema surrounding the clitoral hood and anterior labia minora) on the right labia. There is rash on the left labia.  Neurological: She is alert and oriented to person, place, and time.  Skin: Skin is warm and dry. No erythema.  Psychiatric: She has a normal mood and affect.   Results for orders placed during the hospital encounter of 01/19/13 (from the past 24 hour(s))  URINALYSIS, ROUTINE W REFLEX MICROSCOPIC     Status: Abnormal   Collection Time    01/19/13  9:10 PM      Result Value Range   Color, Urine YELLOW  YELLOW   APPearance  CLEAR  CLEAR   Specific Gravity, Urine >1.030 (*) 1.005 - 1.030   pH 5.5  5.0 - 8.0   Glucose, UA NEGATIVE  NEGATIVE mg/dL   Hgb urine dipstick LARGE (*) NEGATIVE   Bilirubin Urine NEGATIVE  NEGATIVE   Ketones, ur 15 (*) NEGATIVE mg/dL   Protein, ur NEGATIVE  NEGATIVE mg/dL   Urobilinogen, UA 0.2  0.0 - 1.0 mg/dL   Nitrite NEGATIVE  NEGATIVE   Leukocytes, UA NEGATIVE  NEGATIVE  URINE MICROSCOPIC-ADD ON     Status: Abnormal   Collection Time    01/19/13  9:10 PM      Result Value Range   Squamous Epithelial / LPF FEW (*) RARE   WBC, UA 0-2  <3 WBC/hpf   RBC / HPF 7-10  <3 RBC/hpf   Bacteria, UA RARE  RARE   Urine-Other MUCOUS PRESENT      MAU Course   Procedures None  MDM Wet prep and GC/Chlamydia negative on 01/16/13 UA today Diflucan given in MAU Assessment and Plan  A: Vulvovaginal candidiasis  P: Discharge home Rx for nystatin cream sent to patient's pharmacy Encouraged increased PO hydration Patient encouraged to follow-up with a PCP if symptoms should worsen or persist Patient may return to MAU as needed  Freddi Starr, PA-C  01/19/2013, 10:23 PM

## 2013-01-19 NOTE — MAU Note (Signed)
Pt states was given meds for a UTI in [facility] ED last week for a UTI-sttes it helps but the lower abd pain is still there-she was seen here at [facility] on Sun. For continued lower abd pain-was told to finish her meds she was given at [facility] and to come back if the back did not improve-it has not improved

## 2013-01-20 NOTE — MAU Provider Note (Signed)
Attestation of Attending Supervision of Advanced Practitioner (CNM/NP): Evaluation and management procedures were performed by the Advanced Practitioner under my supervision and collaboration.  I have reviewed the Advanced Practitioner's note and chart, and I agree with the management and plan.  Maecie Sevcik 01/20/2013 7:37 AM   

## 2014-05-22 ENCOUNTER — Encounter (HOSPITAL_COMMUNITY): Payer: Self-pay | Admitting: *Deleted

## 2015-11-30 ENCOUNTER — Encounter (HOSPITAL_COMMUNITY): Payer: Self-pay | Admitting: *Deleted

## 2015-11-30 ENCOUNTER — Inpatient Hospital Stay (HOSPITAL_COMMUNITY)
Admission: AD | Admit: 2015-11-30 | Discharge: 2015-11-30 | Disposition: A | Discharge status: Home / Self Care | Payer: Self-pay | Source: Ambulatory Visit | Attending: Obstetrics & Gynecology | Admitting: Obstetrics & Gynecology

## 2015-11-30 ENCOUNTER — Other Ambulatory Visit: Payer: Self-pay | Admitting: Advanced Practice Midwife

## 2015-11-30 ENCOUNTER — Inpatient Hospital Stay (HOSPITAL_COMMUNITY): Payer: Self-pay

## 2015-11-30 DIAGNOSIS — B373 Candidiasis of vulva and vagina: Secondary | ICD-10-CM | POA: Insufficient documentation

## 2015-11-30 DIAGNOSIS — O418X10 Other specified disorders of amniotic fluid and membranes, first trimester, not applicable or unspecified: Secondary | ICD-10-CM

## 2015-11-30 DIAGNOSIS — Z3A01 Less than 8 weeks gestation of pregnancy: Secondary | ICD-10-CM | POA: Insufficient documentation

## 2015-11-30 DIAGNOSIS — O98811 Other maternal infectious and parasitic diseases complicating pregnancy, first trimester: Secondary | ICD-10-CM | POA: Insufficient documentation

## 2015-11-30 DIAGNOSIS — Z3491 Encounter for supervision of normal pregnancy, unspecified, first trimester: Secondary | ICD-10-CM

## 2015-11-30 DIAGNOSIS — O468X1 Other antepartum hemorrhage, first trimester: Secondary | ICD-10-CM

## 2015-11-30 DIAGNOSIS — O208 Other hemorrhage in early pregnancy: Secondary | ICD-10-CM | POA: Insufficient documentation

## 2015-11-30 DIAGNOSIS — O209 Hemorrhage in early pregnancy, unspecified: Secondary | ICD-10-CM

## 2015-11-30 DIAGNOSIS — B3731 Acute candidiasis of vulva and vagina: Secondary | ICD-10-CM

## 2015-11-30 DIAGNOSIS — O4691 Antepartum hemorrhage, unspecified, first trimester: Secondary | ICD-10-CM

## 2015-11-30 LAB — WET PREP, GENITAL
CLUE CELLS WET PREP: NONE SEEN
SPERM: NONE SEEN
Trich, Wet Prep: NONE SEEN
Yeast Wet Prep HPF POC: NONE SEEN

## 2015-11-30 LAB — CBC
HCT: 36.2 % (ref 36.0–46.0)
HEMOGLOBIN: 12.5 g/dL (ref 12.0–15.0)
MCH: 29.1 pg (ref 26.0–34.0)
MCHC: 34.5 g/dL (ref 30.0–36.0)
MCV: 84.2 fL (ref 78.0–100.0)
Platelets: 353 10*3/uL (ref 150–400)
RBC: 4.3 MIL/uL (ref 3.87–5.11)
RDW: 13.9 % (ref 11.5–15.5)
WBC: 7.2 10*3/uL (ref 4.0–10.5)

## 2015-11-30 LAB — URINALYSIS, ROUTINE W REFLEX MICROSCOPIC
BILIRUBIN URINE: NEGATIVE
Glucose, UA: NEGATIVE mg/dL
Ketones, ur: NEGATIVE mg/dL
Leukocytes, UA: NEGATIVE
NITRITE: NEGATIVE
PROTEIN: NEGATIVE mg/dL
Specific Gravity, Urine: 1.025 (ref 1.005–1.030)
pH: 5.5 (ref 5.0–8.0)

## 2015-11-30 LAB — URINE MICROSCOPIC-ADD ON

## 2015-11-30 LAB — HCG, QUANTITATIVE, PREGNANCY: HCG, BETA CHAIN, QUANT, S: 17348 m[IU]/mL — AB (ref <5)

## 2015-11-30 LAB — POCT PREGNANCY, URINE: PREG TEST UR: POSITIVE — AB

## 2015-11-30 MED ORDER — TERCONAZOLE 0.4 % VA CREA: 1.0000 | TOPICAL_CREAM | Freq: Every day | VAGINAL | Status: DC

## 2015-11-30 MED ORDER — TERCONAZOLE 0.8 % VA CREA: 1.0000 | TOPICAL_CREAM | Freq: Every day | VAGINAL | Status: DC

## 2015-11-30 MED ORDER — HYDROCORTISONE 1 % EX CREA: TOPICAL_CREAM | CUTANEOUS | Status: DC

## 2015-11-30 NOTE — Discharge Instructions (Signed)
Hemorragia vaginal durante el embarazo (primer trimestre)  (Vaginal Bleeding During Pregnancy, First Trimester)  Durante los primeros meses del embarazo es relativamente frecuente que se presente una pequeña hemorragia (manchas). Esta situación generalmente mejora por sí misma. Estas hemorragias o manchas tienen diversas causas al inicio del embarazo. Algunas manchas pueden estar relacionadas al embarazo y otras no. En la mayoría de los casos, la hemorragia es normal y no es un problema. Sin embargo, la hemorragia también puede ser un signo de algo grave. Debe informar a su médico de inmediato si tiene alguna hemorragia vaginal.  Algunas causas posibles de hemorragia vaginal durante el primer trimestre incluyen:  · Infección o inflamación del cuello del útero.  · Crecimientos anormales (pólipos) en el cuello del útero.  · Aborto espontáneo o amenaza de aborto espontáneo.  · Tejido del embarazo se ha desarrollado fuera del útero y en una trompa de falopio (embarazo ectópico).  · Se han desarrollado pequeños quistes en el útero en lugar de tejido de embarazo (embarazo molar).  INSTRUCCIONES PARA EL CUIDADO EN EL HOGAR   Controle su afección para ver si hay cambios. Las siguientes indicaciones ayudarán a aliviar cualquier molestia que pueda sentir:  · Siga las indicaciones del médico para restringir su actividad. Si el médico le indica descanso en la cama, debe quedarse en la cama y levantarse solo para ir al baño. No obstante, el médico puede permitirle que continué con tareas livianas.  · Si es necesario, organícese para que alguien le ayude con las actividades y responsabilidades cotidianas mientras está en cama.  · Lleve un registro de la cantidad y la saturación de las toallas higiénicas que utiliza cada día. Anote este dato.  · No use tampones.No se haga duchas vaginales.  · No tenga relaciones sexuales u orgasmos hasta que el médico la autorice.  · Si elimina tejido por la vagina, guárdelo para mostrárselo al  médico.  · Tome solo medicamentos de venta libre o recetados, según las indicaciones del médico.  · No tome aspirina, ya que puede causar hemorragias.  · Cumpla con todas las visitas de control, según le indique su médico.  SOLICITE ATENCIÓN MÉDICA SI:  · Tiene un sangrado vaginal en cualquier momento del embarazo.  · Tiene calambres o dolores de parto.  · Tiene fiebre que los medicamentos no pueden controlar.  SOLICITE ATENCIÓN MÉDICA DE INMEDIATO SI:   · Siente calambres intensos en la espalda o en el vientre (abdomen).  · Elimina coágulos grandes o tejido por la vagina.  · La hemorragia aumenta.  · Si se siente mareada, débil o se desmaya.  · Tiene escalofríos.  · Tiene una pérdida importante o sale líquido a borbotones por la vagina.  · Se desmaya mientras defeca.  ASEGÚRESE DE QUE:  · Comprende estas instrucciones.  · Controlará su afección.  · Recibirá ayuda de inmediato si no mejora o si empeora.     Esta información no tiene como fin reemplazar el consejo del médico. Asegúrese de hacerle al médico cualquier pregunta que tenga.     Document Released: 04/16/2005 Document Revised: 07/12/2013  Elsevier Interactive Patient Education ©2016 [employer] Inc[employer]

## 2015-11-30 NOTE — MAU Note (Signed)
Pt was expecting her period to start any time but has not.  Some bright red spotting on Tues and Wed.  Took home UPT and was positive.  Concerned about the spotting.  Pt has a lot of itching and burning, been using vaginal OTC cream x 1 week without any changes.

## 2015-11-30 NOTE — Progress Notes (Signed)
Pharmacy called to report Terazol 7 on backorder.  Changed Rx to Terazol 3.

## 2015-11-30 NOTE — MAU Provider Note (Signed)
Chief Complaint: Vaginal Bleeding; Possible Pregnancy; and Vaginal Discharge   First Provider Initiated Contact with Patient 11/30/15 0946      SUBJECTIVE HPI: Dorothy Vaughan is a 35 y.o. [id number] at [redacted]w[redacted]d by sure LMP who presents to maternity admissions reporting vaginal discharge with itching x 1 week and vaginal spotting x 2 days. She has tried Monistat 7 and used 6 out of 7 nights of treatment already but still has significant vaginal itching.  She reports the spotting is light, seen when she wipes only, and not associated with abdominal pain.  She has not tried any treatments for bleeding and the bleeding is intermittent and unchanged since onset.  She denies vaginal itching/burning, urinary symptoms, h/a, dizziness, n/v, or fever/chills.     HPI  Past Medical History  Diagnosis Date  . Anxiety   . Depression   . Preeclampsia   . Seizures (HCC)     with pregnancy  . Eclampsia    Past Surgical History  Procedure Laterality Date  . Cesarean section    . Cesarean section     Social History   Social History  . Marital Status: Married    Spouse Name: N/A  . Number of Children: N/A  . Years of Education: N/A   Occupational History  . Not on file.   Social History Main Topics  . Smoking status: Never Smoker   . Smokeless tobacco: Never Used  . Alcohol Use: No  . Drug Use: No  . Sexual Activity: Yes    Birth Control/ Protection: None   Other Topics Concern  . Not on file   Social History Narrative   No current facility-administered medications on file prior to encounter.   No current outpatient prescriptions on file prior to encounter.   No Known Allergies  ROS:  Review of Systems  Constitutional: Negative for fever, chills and fatigue.  Respiratory: Negative for shortness of breath.   Cardiovascular: Negative for chest pain.  Genitourinary: Positive for vaginal bleeding and vaginal discharge. Negative for dysuria, flank pain, difficulty urinating, vaginal  pain and pelvic pain.  Neurological: Negative for dizziness and headaches.  Psychiatric/Behavioral: Negative.      I have reviewed patient's Past Medical Hx, Surgical Hx, Family Hx, Social Hx, medications and allergies.   Physical Exam  Patient Vitals for the past 24 hrs:  BP Temp Temp src Pulse Resp SpO2  11/30/15 0919 127/73 mmHg 98.7 F (37.1 C) Oral 72 18 100 %   Constitutional: Well-developed, well-nourished female in no acute distress.  Cardiovascular: normal rate Respiratory: normal effort GI: Abd soft, non-tender. Pos BS x 4 MS: Extremities nontender, no edema, normal ROM Neurologic: Alert and oriented x 4.  GU: Neg CVAT.  PELVIC EXAM: Cervix pink, visually closed, without lesion, scant white creamy discharge, vaginal walls and external genitalia normal Bimanual exam: Cervix 0/long/high, firm, anterior, neg CMT, uterus nontender, nonenlarged, adnexa without tenderness, enlargement, or mass   LAB RESULTS Results for orders placed or performed during the hospital encounter of 11/30/15 (from the past 24 hour(s))  Urinalysis, Routine w reflex microscopic (not at [facility])     Status: Abnormal   Collection Time: 11/30/15  9:02 AM  Result Value Ref Range   Color, Urine YELLOW YELLOW   APPearance CLEAR CLEAR   Specific Gravity, Urine 1.025 1.005 - 1.030   pH 5.5 5.0 - 8.0   Glucose, UA NEGATIVE NEGATIVE mg/dL   Hgb urine dipstick MODERATE (A) NEGATIVE   Bilirubin Urine NEGATIVE NEGATIVE  Ketones, ur NEGATIVE NEGATIVE mg/dL   Protein, ur NEGATIVE NEGATIVE mg/dL   Nitrite NEGATIVE NEGATIVE   Leukocytes, UA NEGATIVE NEGATIVE  Urine microscopic-add on     Status: Abnormal   Collection Time: 11/30/15  9:02 AM  Result Value Ref Range   Squamous Epithelial / LPF 0-5 (A) NONE SEEN   WBC, UA 0-5 0 - 5 WBC/hpf   RBC / HPF 0-5 0 - 5 RBC/hpf   Bacteria, UA FEW (A) NONE SEEN   Urine-Other MUCOUS PRESENT   Pregnancy, urine POC     Status: Abnormal   Collection Time: 11/30/15  9:14  AM  Result Value Ref Range   Preg Test, Ur POSITIVE (A) NEGATIVE      IMAGING No results found.  MAU Management/MDM: Ordered labs and reviewed results.  IUP on US, no evidence of bleeding on exam.  Will treat for clinical evidence of vaginal candidasis with terazol 7.  Pt stable at time of discharge.  ASSESSMENT  1. Normal IUP (intrauterine pregnancy) on prenatal ultrasound, first trimester   2. Vaginal bleeding in pregnancy, first trimester   3. Subchorionic hemorrhage in first trimester   4. Vaginal candidiasis    PLAN Discharge home F/U with early prenatal care    Medication List    ASK your doctor about these medications        prenatal multivitamin Tabs tablet  Take 1 tablet by mouth daily at 12 noon.         Sharen CounterLisa Leftwich-Kirby Certified Nurse-Midwife 11/30/2015  9:58 AM

## 2015-12-01 LAB — HIV ANTIBODY (ROUTINE TESTING W REFLEX): HIV SCREEN 4TH GENERATION: NONREACTIVE

## 2015-12-03 LAB — GC/CHLAMYDIA PROBE AMP (~~LOC~~) NOT AT ARMC
Chlamydia: NEGATIVE
NEISSERIA GONORRHEA: NEGATIVE

## 2016-01-09 ENCOUNTER — Encounter (HOSPITAL_COMMUNITY): Payer: Self-pay

## 2016-01-09 ENCOUNTER — Inpatient Hospital Stay (HOSPITAL_COMMUNITY): Payer: Self-pay

## 2016-01-09 ENCOUNTER — Inpatient Hospital Stay (HOSPITAL_COMMUNITY)
Admission: AD | Admit: 2016-01-09 | Discharge: 2016-01-10 | Disposition: A | Discharge status: Home / Self Care | Payer: Self-pay | Source: Ambulatory Visit | Attending: Obstetrics & Gynecology | Admitting: Obstetrics & Gynecology

## 2016-01-09 DIAGNOSIS — O039 Complete or unspecified spontaneous abortion without complication: Secondary | ICD-10-CM | POA: Insufficient documentation

## 2016-01-09 DIAGNOSIS — N390 Urinary tract infection, site not specified: Secondary | ICD-10-CM

## 2016-01-09 DIAGNOSIS — F329 Major depressive disorder, single episode, unspecified: Secondary | ICD-10-CM | POA: Insufficient documentation

## 2016-01-09 DIAGNOSIS — F419 Anxiety disorder, unspecified: Secondary | ICD-10-CM | POA: Insufficient documentation

## 2016-01-09 DIAGNOSIS — O208 Other hemorrhage in early pregnancy: Secondary | ICD-10-CM | POA: Insufficient documentation

## 2016-01-09 DIAGNOSIS — O4691 Antepartum hemorrhage, unspecified, first trimester: Secondary | ICD-10-CM

## 2016-01-09 DIAGNOSIS — O209 Hemorrhage in early pregnancy, unspecified: Secondary | ICD-10-CM

## 2016-01-09 DIAGNOSIS — Z3A11 11 weeks gestation of pregnancy: Secondary | ICD-10-CM | POA: Insufficient documentation

## 2016-01-09 DIAGNOSIS — O2341 Unspecified infection of urinary tract in pregnancy, first trimester: Secondary | ICD-10-CM | POA: Insufficient documentation

## 2016-01-09 DIAGNOSIS — O99342 Other mental disorders complicating pregnancy, second trimester: Secondary | ICD-10-CM | POA: Insufficient documentation

## 2016-01-09 LAB — URINE MICROSCOPIC-ADD ON

## 2016-01-09 LAB — URINALYSIS, ROUTINE W REFLEX MICROSCOPIC
BILIRUBIN URINE: NEGATIVE
Glucose, UA: NEGATIVE mg/dL
KETONES UR: NEGATIVE mg/dL
Leukocytes, UA: NEGATIVE
NITRITE: POSITIVE — AB
PH: 5.5 (ref 5.0–8.0)
PROTEIN: NEGATIVE mg/dL
Specific Gravity, Urine: >1.03 — ABNORMAL HIGH (ref 1.005–1.030)

## 2016-01-09 LAB — HCG, QUANTITATIVE, PREGNANCY: hCG, Beta Chain, Quant, S: 14368 m[IU]/mL — ABNORMAL HIGH (ref <5)

## 2016-01-09 LAB — CBC
HCT: 36.8 % (ref 36.0–46.0)
HEMOGLOBIN: 12.4 g/dL (ref 12.0–15.0)
MCH: 28.6 pg (ref 26.0–34.0)
MCHC: 33.7 g/dL (ref 30.0–36.0)
MCV: 84.8 fL (ref 78.0–100.0)
PLATELETS: 357 10*3/uL (ref 150–400)
RBC: 4.34 MIL/uL (ref 3.87–5.11)
RDW: 13.9 % (ref 11.5–15.5)
WBC: 9.5 10*3/uL (ref 4.0–10.5)

## 2016-01-09 LAB — WET PREP, GENITAL
CLUE CELLS WET PREP: NONE SEEN
Sperm: NONE SEEN
Trich, Wet Prep: NONE SEEN
Yeast Wet Prep HPF POC: NONE SEEN

## 2016-01-09 MED ORDER — IBUPROFEN 800 MG PO TABS: 800.0000 mg | ORAL_TABLET | Freq: Three times a day (TID) | ORAL | Status: AC | PRN

## 2016-01-09 MED ORDER — HYDROCODONE-IBUPROFEN 5-200 MG PO TABS: 1.0000 | ORAL_TABLET | Freq: Three times a day (TID) | ORAL | Status: AC | PRN

## 2016-01-09 MED ORDER — NITROFURANTOIN MONOHYD MACRO 100 MG PO CAPS: 100.0000 mg | ORAL_CAPSULE | Freq: Two times a day (BID) | ORAL | Status: AC

## 2016-01-09 NOTE — MAU Note (Signed)
Pt c/o vaginal bleeding that started Sunday. States that Sunday she noticed a green discharge-no itching or odor. States now that it is brown with some small clots. Some mild abdominal cramping that she rates 2/10. Came 3 weeks ago and was told that she had a vaginal infection and was given a cream-worked for one week but it came back-not sure if it is okay to continue using.

## 2016-01-09 NOTE — Discharge Instructions (Signed)
Aborto espontáneo  °(Miscarriage) °El aborto espontáneo es la pérdida de un bebé que no ha nacido (feto) antes de la semana 20 del embarazo. La mayor parte de estos abortos ocurre en los primeros 3 meses. En algunos casos ocurre antes de que la mujer sepa que está embarazada. También se denomina "aborto espontáneo" o "pérdida prematura del embarazo". El aborto espontáneo puede ser una experiencia que afecte emocionalmente a la persona. Converse con su médico si tiene dudas, cómo es el proceso de duelo, y sobre planes futuros de embarazo.  °CAUSAS  °· Algunos problemas cromosómicos pueden hacer imposible que el bebé se desarrolle normalmente. Los problemas con los genes o cromosomas del bebé son generalmente el resultado de errores que se producen, por casualidad, cuando el embrión se divide y crece. Estos problemas no se heredan de los padres. °· Infección en el cuello del útero.   °· Problemas hormonales.   °· Problemas en el cuello del útero, como tener un útero incompetente. Esto ocurre cuando los tejidos no son lo suficientemente fuertes como para contener el embarazo.   °· Problemas del útero, como un útero con forma anormal, los fibromas o anormalidades congénitas.   °· Ciertas enfermedades crónicas.   °· No fume, no beba alcohol, ni consuma drogas.   °· Traumatismos   °A veces, la causa es desconocida.  °SÍNTOMAS  °· Sangrado o manchado vaginal, con o sin cólicos o dolor. °· Dolor o cólicos en el abdomen o en la cintura. °· Eliminación de líquido, tejidos o coágulos grandes por la vagina. °DIAGNÓSTICO  °El médico le hará un examen físico. También le indicará una ecografía para confirmar el aborto. Es posible que se realicen análisis de sangre.  °TRATAMIENTO  °· En algunos casos el tratamiento no es necesario, si se eliminan naturalmente todos los tejidos embrionarios que se encontraban en el útero. Si el feto o la placenta quedan dentro del útero (aborto incompleto), pueden infectarse, los tejidos que quedan  pueden infectarse y deben retirarse. Generalmente se realiza un procedimiento de dilatación y curetaje (D y C). Durante el procedimiento de dilatación y curetaje, el cuello del útero se abre (dilata) y se retira cualquier resto de tejido fetal o placentario del útero. °· Si hay una infección, le recetarán antibióticos. Podrán recetarle otros medicamentos para reducir el tamaño del útero (contraerlo) si hay una mucho sangrado. °· Si su sangre es Rh negativa y su bebé es Rh positivo, usted necesitará la inyección de inmunoglobulina Rh. Esta inyección protegerá a los futuros bebés de tener problemas de compatibilidad Rh en futuros embarazos. °INSTRUCCIONES PARA EL CUIDADO EN EL HOGAR  °· El médico le indicará reposo en cama o le permitirá realizar actividades livianas. Vuelva a la actividad lentamente o según las indicaciones de su médico. °· Pídale a alguien que la ayude con las responsabilidades familiares y del hogar durante este tiempo.   °· Lleve un registro de la cantidad y la saturación de las toallas higiénicas que utiliza cada día. Anote esta información   °· No use tampones. No No se haga duchas vaginales ni tenga relaciones sexuales hasta que el médico la autorice.   °· Sólo tome medicamentos de venta libre o recetados para calmar el dolor o el malestar, según las indicaciones de su médico.   °· No tome aspirina. La aspirina puede ocasionar hemorragias.   °· Concurra puntualmente a las citas de control con el médico.   °· Si usted o su pareja tienen dificultades con el duelo, hable con su médico para buscar la ayuda psicológica que los ayude a enfrentar la pérdida   del embarazo. Permítase el tiempo suficiente de duelo antes de quedar embarazada nuevamente.   °SOLICITE ATENCIÓN MÉDICA DE INMEDIATO SI:  °· Siente calambres intensos o dolor en la espalda o en el abdomen. °· Tiene fiebre. °· Elimina grandes coágulos de sangre (del tamaño de una nuez o más) o tejidos por la vagina. Guarde lo que ha eliminado para  que su médico lo examine.   °· La hemorragia aumenta.   °· Observa una secreción vaginal espesa y con mal olor. °· Se siente mareada, débil, o se desmaya.   °· Siente escalofríos.   °ASEGÚRESE DE QUE:  °· Comprende estas instrucciones. °· Controlará su enfermedad. °· Solicitará ayuda de inmediato si no mejora o si empeora. °  °Esta información no tiene como fin reemplazar el consejo del médico. Asegúrese de hacerle al médico cualquier pregunta que tenga. °  °Document Released: 04/16/2005 Document Revised: 11/01/2012 °Elsevier Interactive Patient Education ©2016 [employer] Inc. ° °

## 2016-01-09 NOTE — MAU Provider Note (Signed)
History     CSN: [id number]  Arrival date and time: 01/09/16 1859   First Provider Initiated Contact with Patient 01/09/16 2104      Chief Complaint  Patient presents with  . Vaginal Bleeding   HPI Comments: Dorothy Vaughan is a 35 y.o. [id number] at [redacted]w[redacted]d who presents today with spotting. She states that she was recently treated for a vaginal infection with a cream. She also reports that her odor has a strong urine.   Vaginal Bleeding The patient's primary symptoms include vaginal bleeding. This is a new problem. Episode onset: started on 01/06/16. The problem has been gradually improving. The patient is experiencing no pain. She is pregnant. Associated symptoms include constipation ("once in a while"). Pertinent negatives include no abdominal pain, chills, diarrhea, dysuria, fever, frequency, nausea, urgency or vomiting. The vaginal discharge was brown. The vaginal bleeding is spotting. She has been passing clots (mucousy globs with some blood. ). She has not been passing tissue. Nothing aggravates the symptoms. She has tried nothing for the symptoms. She is sexually active (but denies any recent intercourse immediatly prior to or after the bleeding started. ). Menstrual history: 10/17/15.    Past Medical History  Diagnosis Date  . Anxiety   . Depression   . Preeclampsia   . Seizures (HCC)     with pregnancy  . Eclampsia     Past Surgical History  Procedure Laterality Date  . Cesarean section    . Cesarean section      Family History  Problem Relation Age of Onset  . Other Neg Hx     Social History  Substance Use Topics  . Smoking status: Never Smoker   . Smokeless tobacco: Never Used  . Alcohol Use: No    Allergies: No Known Allergies  Prescriptions prior to admission  Medication Sig Dispense Refill Last Dose  . Prenatal Vit-Fe Fumarate-FA (PRENATAL MULTIVITAMIN) TABS tablet Take 1 tablet by mouth daily at 12 noon.   01/09/2016 at Unknown time  . hydrocortisone  cream 1 % Apply to affected area 2 times daily (Patient not taking: Reported on 01/09/2016) 15 g 0 Completed Course at Unknown time  . terconazole (TERAZOL 3) 0.8 % vaginal cream Place 1 applicator vaginally at bedtime. (Patient not taking: Reported on 01/09/2016) 20 g 0 Completed Course at Unknown time    Review of Systems  Constitutional: Negative for fever and chills.  Gastrointestinal: Positive for constipation ("once in a while"). Negative for nausea, vomiting, abdominal pain and diarrhea.  Genitourinary: Positive for vaginal bleeding. Negative for dysuria, urgency and frequency.   Physical Exam   Blood pressure 123/80, pulse 66, temperature 98.6 F (37 C), temperature source Oral, resp. rate 20, height 5\' 2"  (1.575 m), weight 51.256 kg (113 lb), last menstrual period 10/17/2015, SpO2 99 %.  Physical Exam  Nursing note and vitals reviewed. Constitutional: She is oriented to person, place, and time. She appears well-developed and well-nourished. No distress.  HENT:  Head: Normocephalic.  Cardiovascular: Normal rate.   Respiratory: Effort normal.  GI: Soft. There is no tenderness. There is no rebound.  Genitourinary:   External: no lesion Vagina: small amount of mucous-y blood seen  Cervix: pink, smooth, no CMT Uterus: NSSC Adnexa: NT   Neurological: She is alert and oriented to person, place, and time.  Skin: Skin is warm and dry.  Psychiatric: She has a normal mood and affect.   Results for FORRESTINE, LECRONE (MRN [id number]) as of 01/09/2016 22:54  Ref. Range 11/30/2015  09:54 01/09/2016 21:34  HCG, Beta Chain, Quant, S Latest Ref Range: <5 mIU/mL 17348 (H) 14368 (H)   Results for orders placed or performed during the hospital encounter of 01/09/16 (from the past 24 hour(s))  Urinalysis, Routine w reflex microscopic (not at [facility])     Status: Abnormal   Collection Time: 01/09/16  7:37 PM  Result Value Ref Range   Color, Urine YELLOW YELLOW   APPearance CLEAR CLEAR   Specific  Gravity, Urine >1.030 (H) 1.005 - 1.030   pH 5.5 5.0 - 8.0   Glucose, UA NEGATIVE NEGATIVE mg/dL   Hgb urine dipstick LARGE (A) NEGATIVE   Bilirubin Urine NEGATIVE NEGATIVE   Ketones, ur NEGATIVE NEGATIVE mg/dL   Protein, ur NEGATIVE NEGATIVE mg/dL   Nitrite POSITIVE (A) NEGATIVE   Leukocytes, UA NEGATIVE NEGATIVE  Urine microscopic-add on     Status: Abnormal   Collection Time: 01/09/16  7:37 PM  Result Value Ref Range   Squamous Epithelial / LPF 0-5 (A) NONE SEEN   WBC, UA 0-5 0 - 5 WBC/hpf   RBC / HPF 0-5 0 - 5 RBC/hpf   Bacteria, UA MANY (A) NONE SEEN  Wet prep, genital     Status: Abnormal   Collection Time: 01/09/16  9:14 PM  Result Value Ref Range   Yeast Wet Prep HPF POC NONE SEEN NONE SEEN   Trich, Wet Prep NONE SEEN NONE SEEN   Clue Cells Wet Prep HPF POC NONE SEEN NONE SEEN   WBC, Wet Prep HPF POC FEW (A) NONE SEEN   Sperm NONE SEEN   CBC     Status: None   Collection Time: 01/09/16  9:34 PM  Result Value Ref Range   WBC 9.5 4.0 - 10.5 K/uL   RBC 4.34 3.87 - 5.11 MIL/uL   Hemoglobin 12.4 12.0 - 15.0 g/dL   HCT 16.136.8 09.636.0 - 04.546.0 %   MCV 84.8 78.0 - 100.0 fL   MCH 28.6 26.0 - 34.0 pg   MCHC 33.7 30.0 - 36.0 g/dL   RDW 40.913.9 81.111.5 - 91.415.5 %   Platelets 357 150 - 400 K/uL  hCG, quantitative, pregnancy     Status: Abnormal   Collection Time: 01/09/16  9:34 PM  Result Value Ref Range   hCG, Beta Chain, Quant, S 14368 (H) <5 mIU/mL   [place] Ob Comp Less 14 Wks  01/09/2016  CLINICAL DATA:  35 year old pregnant female with vaginal bleeding x4 days. EXAM: OBSTETRIC <14 WK ULTRASOUND TECHNIQUE: Transabdominal ultrasound was performed for evaluation of the gestation as well as the maternal uterus and adnexal regions. COMPARISON:  Ultrasound dated 11/30/2015 FINDINGS: There is a cystic structure within the endometrial canal. No yolk sac or fetal pole identified within this structure. This cystic structure has a mean sac diameter of 25 mm which corresponds to an estimated gestational  age of [redacted] weeks, 4 days. The absence of fetal pole or a yolk sac at this time is highly suspicious for a nonviable pregnancy. This may represent a blighted ovum or failed pregnancy seen on the previous ultrasound. Subchorionic hemorrhage:  Small subchorionic tender Maternal uterus/adnexae: The maternal ovaries appear unremarkable. No free fluid. IMPRESSION: Cystic structure within the endometrial canal as described suspicious for failed pregnancy. Clinical correlation and follow-up recommended. Electronically Signed   By: Elgie CollardArash  Radparvar M.D.   On: 01/09/2016 22:35    MAU Course  Procedures  MDM Reviewed results with the patient. HCG is decreasing. Indicates SAB. Offered cytotec v expectant management.  Patient would like expectant management. Will FU in the office in 2 weeks.   Assessment and Plan   1. UTI (lower urinary tract infection)   2. Vaginal bleeding in pregnancy, first trimester   3. SAB (spontaneous abortion)    DC home Comfort measures reviewed  Bleeding precautions RX: macrobid BID #10, vicodin PRN, Ibuprofen PRN  Return to MAU as needed FU with OB as planned  Follow-up Information    Follow up with [facility].   Specialty:  Obstetrics and Gynecology   Why:  They will call you with an appointment   Contact information:   [address] [place] [address] [phone]        Dorothy Vaughan 01/09/2016, 9:06 PM

## 2016-01-10 LAB — RPR: RPR: NONREACTIVE

## 2016-01-10 LAB — GC/CHLAMYDIA PROBE AMP (~~LOC~~) NOT AT ARMC
Chlamydia: NEGATIVE
NEISSERIA GONORRHEA: NEGATIVE

## 2016-01-10 LAB — HIV ANTIBODY (ROUTINE TESTING W REFLEX): HIV SCREEN 4TH GENERATION: NONREACTIVE

## 2016-01-15 ENCOUNTER — Inpatient Hospital Stay (HOSPITAL_COMMUNITY)
Admission: AD | Admit: 2016-01-15 | Discharge: 2016-01-15 | Disposition: A | Discharge status: Home / Self Care | Payer: Self-pay | Source: Ambulatory Visit | Attending: Family Medicine | Admitting: Family Medicine

## 2016-01-15 ENCOUNTER — Encounter (HOSPITAL_COMMUNITY): Payer: Self-pay

## 2016-01-15 ENCOUNTER — Inpatient Hospital Stay (HOSPITAL_COMMUNITY): Payer: Self-pay

## 2016-01-15 DIAGNOSIS — O039 Complete or unspecified spontaneous abortion without complication: Secondary | ICD-10-CM | POA: Insufficient documentation

## 2016-01-15 DIAGNOSIS — O209 Hemorrhage in early pregnancy, unspecified: Secondary | ICD-10-CM

## 2016-01-15 DIAGNOSIS — F329 Major depressive disorder, single episode, unspecified: Secondary | ICD-10-CM | POA: Insufficient documentation

## 2016-01-15 DIAGNOSIS — F419 Anxiety disorder, unspecified: Secondary | ICD-10-CM | POA: Insufficient documentation

## 2016-01-15 LAB — CBC
HEMATOCRIT: 35.7 % — AB (ref 36.0–46.0)
Hemoglobin: 12.2 g/dL (ref 12.0–15.0)
MCH: 28.8 pg (ref 26.0–34.0)
MCHC: 34.2 g/dL (ref 30.0–36.0)
MCV: 84.2 fL (ref 78.0–100.0)
PLATELETS: 360 10*3/uL (ref 150–400)
RBC: 4.24 MIL/uL (ref 3.87–5.11)
RDW: 13.9 % (ref 11.5–15.5)
WBC: 9.3 10*3/uL (ref 4.0–10.5)

## 2016-01-15 LAB — HCG, QUANTITATIVE, PREGNANCY: hCG, Beta Chain, Quant, S: 5718 m[IU]/mL — ABNORMAL HIGH (ref <5)

## 2016-01-15 MED ORDER — LACTATED RINGERS IV BOLUS (SEPSIS): 1000.0000 mL | Freq: Once | INTRAVENOUS | Status: DC

## 2016-01-15 NOTE — Discharge Instructions (Signed)
Aborto espontáneo  °(Miscarriage) °El aborto espontáneo es la pérdida de un bebé que no ha nacido (feto) antes de la semana 20 del embarazo. La mayor parte de estos abortos ocurre en los primeros 3 meses. En algunos casos ocurre antes de que la mujer sepa que está embarazada. También se denomina "aborto espontáneo" o "pérdida prematura del embarazo". El aborto espontáneo puede ser una experiencia que afecte emocionalmente a la persona. Converse con su médico si tiene dudas, cómo es el proceso de duelo, y sobre planes futuros de embarazo.  °CAUSAS  °· Algunos problemas cromosómicos pueden hacer imposible que el bebé se desarrolle normalmente. Los problemas con los genes o cromosomas del bebé son generalmente el resultado de errores que se producen, por casualidad, cuando el embrión se divide y crece. Estos problemas no se heredan de los padres. °· Infección en el cuello del útero.   °· Problemas hormonales.   °· Problemas en el cuello del útero, como tener un útero incompetente. Esto ocurre cuando los tejidos no son lo suficientemente fuertes como para contener el embarazo.   °· Problemas del útero, como un útero con forma anormal, los fibromas o anormalidades congénitas.   °· Ciertas enfermedades crónicas.   °· No fume, no beba alcohol, ni consuma drogas.   °· Traumatismos   °A veces, la causa es desconocida.  °SÍNTOMAS  °· Sangrado o manchado vaginal, con o sin cólicos o dolor. °· Dolor o cólicos en el abdomen o en la cintura. °· Eliminación de líquido, tejidos o coágulos grandes por la vagina. °DIAGNÓSTICO  °El médico le hará un examen físico. También le indicará una ecografía para confirmar el aborto. Es posible que se realicen análisis de sangre.  °TRATAMIENTO  °· En algunos casos el tratamiento no es necesario, si se eliminan naturalmente todos los tejidos embrionarios que se encontraban en el útero. Si el feto o la placenta quedan dentro del útero (aborto incompleto), pueden infectarse, los tejidos que quedan  pueden infectarse y deben retirarse. Generalmente se realiza un procedimiento de dilatación y curetaje (D y C). Durante el procedimiento de dilatación y curetaje, el cuello del útero se abre (dilata) y se retira cualquier resto de tejido fetal o placentario del útero. °· Si hay una infección, le recetarán antibióticos. Podrán recetarle otros medicamentos para reducir el tamaño del útero (contraerlo) si hay una mucho sangrado. °· Si su sangre es Rh negativa y su bebé es Rh positivo, usted necesitará la inyección de inmunoglobulina Rh. Esta inyección protegerá a los futuros bebés de tener problemas de compatibilidad Rh en futuros embarazos. °INSTRUCCIONES PARA EL CUIDADO EN EL HOGAR  °· El médico le indicará reposo en cama o le permitirá realizar actividades livianas. Vuelva a la actividad lentamente o según las indicaciones de su médico. °· Pídale a alguien que la ayude con las responsabilidades familiares y del hogar durante este tiempo.   °· Lleve un registro de la cantidad y la saturación de las toallas higiénicas que utiliza cada día. Anote esta información   °· No use tampones. No No se haga duchas vaginales ni tenga relaciones sexuales hasta que el médico la autorice.   °· Sólo tome medicamentos de venta libre o recetados para calmar el dolor o el malestar, según las indicaciones de su médico.   °· No tome aspirina. La aspirina puede ocasionar hemorragias.   °· Concurra puntualmente a las citas de control con el médico.   °· Si usted o su pareja tienen dificultades con el duelo, hable con su médico para buscar la ayuda psicológica que los ayude a enfrentar la pérdida   del embarazo. Permítase el tiempo suficiente de duelo antes de quedar embarazada nuevamente.   °SOLICITE ATENCIÓN MÉDICA DE INMEDIATO SI:  °· Siente calambres intensos o dolor en la espalda o en el abdomen. °· Tiene fiebre. °· Elimina grandes coágulos de sangre (del tamaño de una nuez o más) o tejidos por la vagina. Guarde lo que ha eliminado para  que su médico lo examine.   °· La hemorragia aumenta.   °· Observa una secreción vaginal espesa y con mal olor. °· Se siente mareada, débil, o se desmaya.   °· Siente escalofríos.   °ASEGÚRESE DE QUE:  °· Comprende estas instrucciones. °· Controlará su enfermedad. °· Solicitará ayuda de inmediato si no mejora o si empeora. °  °Esta información no tiene como fin reemplazar el consejo del médico. Asegúrese de hacerle al médico cualquier pregunta que tenga. °  °Document Released: 04/16/2005 Document Revised: 11/01/2012 °Elsevier Interactive Patient Education ©2016 Elsevier Inc. ° °Reposo pélvico  °(Pelvic Rest) °El reposo pélvico se recomienda a las mujeres cuando:  °· La placenta cubre parcial o completamente la abertura del cuello del útero (placenta previa). °· Hay sangrado entre la pared del útero y el saco amniótico en el primer trimestre (hemorragia subcoriónica). °· El cuello uterino comienza a abrirse sin iniciarse el trabajo de parto (cuello uterino incompetente, insuficiencia cervical). °· El trabajo de parto se inicia muy pronto (parto prematuro). °INSTRUCCIONES PARA EL CUIDADO EN EL HOGAR  °· No tenga relaciones sexuales, estimulación, ni orgasmos. °· No use tampones, no se haga duchas vaginales ni coloque ningún objeto en la vagina. °· No levante objetos que pesen más de 10 libras (4,5 kg). °· Evite las actividades extenuantes o tensionar los músculos de la pelvis. °SOLICITE ATENCIÓN MÉDICA SI:   °· Tiene un sangrado vaginal durante el embarazo. Considérelo como una posible emergencia. °· Siente cólicos en la zona baja del estómago (más fuertes que los cólicos menstruales). °· Nota flujo vaginal (acuoso, con moco o sangre). °· Siente un dolor en la espalda leve y sordo. °· Tiene contracciones regulares o endurecimiento del útero. °SOLICITE ATENCIÓN MÉDICA DE INMEDIATO SI:  °Observa sangrado vaginal y tiene placenta previa.  °  °Esta información no tiene como fin reemplazar el consejo del médico. Asegúrese  de hacerle al médico cualquier pregunta que tenga. °  °Document Released: 03/31/2012 °Elsevier Interactive Patient Education ©2016 Elsevier Inc. ° °

## 2016-01-15 NOTE — MAU Note (Signed)
Pt presents complaining of heavy vaginal bleeding that started with pain in her abdomen this am. States she started bleeding heavily with large clots and is soaking pads in less than 15 minutes since 1800.

## 2016-01-15 NOTE — MAU Provider Note (Signed)
History     CSN: [id number]  Arrival date and time: 01/15/16 [id number]   First Provider Initiated Contact with Patient 01/15/16 1937      Chief Complaint  Patient presents with  . Vaginal Bleeding   HPI  Dorothy Vaughan is a 35 y.o. [id number] at 2483w2d previously diagnosed with SAB who presents with vaginal bleeding & abdominal pain. Symptoms worsened at 6 pm. States she has saturated several large pads & continues to pass large clots since 6 pm. Constant lower abdominal cramping. Rates pain 8/10.   Spanish interpreter at bedside.    OB History    Gravida Para Term Preterm AB TAB SAB Ectopic Multiple Living   6 2 2  0 1 0 1 0 0 2      Past Medical History  Diagnosis Date  . Anxiety   . Depression   . Preeclampsia   . Seizures (HCC)     with pregnancy  . Eclampsia     Past Surgical History  Procedure Laterality Date  . Cesarean section    . Cesarean section      Family History  Problem Relation Age of Onset  . Other Neg Hx     Social History  Substance Use Topics  . Smoking status: Never Smoker   . Smokeless tobacco: Never Used  . Alcohol Use: No    Allergies: No Known Allergies  Prescriptions prior to admission  Medication Sig Dispense Refill Last Dose  . hydrocodone-ibuprofen (VICOPROFEN) 5-200 MG tablet Take 1 tablet by mouth every 8 (eight) hours as needed for pain. 20 tablet 0   . ibuprofen (ADVIL,MOTRIN) 800 MG tablet Take 1 tablet (800 mg total) by mouth every 8 (eight) hours as needed. 30 tablet 0   . nitrofurantoin, macrocrystal-monohydrate, (MACROBID) 100 MG capsule Take 1 capsule (100 mg total) by mouth 2 (two) times daily. 10 capsule 0   . Prenatal Vit-Fe Fumarate-FA (PRENATAL MULTIVITAMIN) TABS tablet Take 1 tablet by mouth daily at 12 noon.   01/09/2016 at Unknown time   [place] Ob Transvaginal  01/15/2016  CLINICAL DATA:  Vaginal bleeding.  Miscarriage. EXAM: TRANSVAGINAL OB ULTRASOUND TECHNIQUE: Transvaginal ultrasound was performed for complete  evaluation of the gestation as well as the maternal uterus, adnexal regions, and pelvic cul-de-sac. COMPARISON:  January 09, 2016 FINDINGS: The suspected failed pregnancy seen in the endometrial canal on the comparison study is no longer visualized. No intrauterine pregnancy, yolk sac, or embryo identified. The endometrial stripe complex measures 15 mm and is heterogeneous. Swirling debris is seen within the endometrial canal. No fluid in the cul-de-sac. The ovaries are normal. HISTORY OF PRESENT ILLNESS: 1. Mild endometrial thickening and heterogeneity with no focal mass. There is mild blood flow within the endometrium and swirling debris is seen within the canal, likely blood products. In the immediate postpartum setting, the findings are nonspecific and could indicate endometrial blood products versus hypovascular retained products of conception. Consider short-term follow-up ultrasound or further evaluation with pelvic MRI with and without contrast as clinically warranted. These results will be called to the ordering clinician or representative by the Radiologist Assistant, and communication documented in the PACS or zVision Dashboard. Electronically Signed   By: Gerome Samavid  Williams III M.D   On: 01/15/2016 21:02    Review of Systems  Constitutional: Negative.   Gastrointestinal: Positive for abdominal pain.  Genitourinary:       + vaginal bleeding  Neurological: Negative for dizziness and headaches.   Physical Exam   Blood pressure 147/89,  pulse 118, temperature 98 F (36.7 C), temperature source Oral, resp. rate 18, last menstrual period 10/17/2015, SpO2 99 %.  Physical Exam  Nursing note and vitals reviewed. Constitutional: She is oriented to person, place, and time. She appears well-developed and well-nourished. No distress.  HENT:  Head: Normocephalic and atraumatic.  Eyes: Conjunctivae are normal. Right eye exhibits no discharge. Left eye exhibits no discharge. No scleral icterus.  Neck:  Normal range of motion.  Cardiovascular: Normal rate, regular rhythm and normal heart sounds.   No murmur heard. Respiratory: Effort normal and breath sounds normal. No respiratory distress. She has no wheezes.  GI: Soft. Bowel sounds are normal. She exhibits no distension. There is no tenderness.  Genitourinary: Uterus is not enlarged and not tender.  Moderate amount of dark red blood. Several large clots removed. Large tissue removed from cervical os-- pt reports resolution of pain after removal.   Cervix dilated 1 cm Moderate amount of thin bright red bleeding continues   Neurological: She is alert and oriented to person, place, and time.  Skin: Skin is warm and dry. She is not diaphoretic.  Psychiatric: She has a normal mood and affect. Her behavior is normal. Judgment and thought content normal.    MAU Course  Procedures Results for orders placed or performed during the hospital encounter of 01/15/16 (from the past 24 hour(s))  CBC     Status: Abnormal   Collection Time: 01/15/16  7:26 PM  Result Value Ref Range   WBC 9.3 4.0 - 10.5 K/uL   RBC 4.24 3.87 - 5.11 MIL/uL   Hemoglobin 12.2 12.0 - 15.0 g/dL   HCT 54.035.7 (L) 98.136.0 - 19.146.0 %   MCV 84.2 78.0 - 100.0 fL   MCH 28.8 26.0 - 34.0 pg   MCHC 34.2 30.0 - 36.0 g/dL   RDW 47.813.9 29.511.5 - 62.115.5 %   Platelets 360 150 - 400 K/uL  hCG, quantitative, pregnancy     Status: Abnormal   Collection Time: 01/15/16  7:26 PM  Result Value Ref Range   hCG, Beta Chain, Quant, S 5718 (H) <5 mIU/mL    MDM O positive Dx SAB last week & chose expectant management Hemoglobin stable Ultrasound - Orthostatic VS normal Vaginal bleeding decreased & minimal pain per patient  [facility] Depaul [facility]      Judeth HornErin Lawrence, NP 01/15/2016 9:20 PM   I resumed at of this patient @ 9:20 PM Vaginal bleeding assessed up discharge: minimal blood noted on exam, bimanual exam with minimal blood noted on glove.  The patient has been up to the bathroom  without dizziness. The patient denies pain at this time and states " I feel much better". Spanish interpretor at bedside for discharge instructions.     Assessment and Plan   A:  1. Miscarriage   2. Vaginal bleeding in pregnancy, first trimester     P:  Discharge home in stable condition Patient has Rx for pain medication from previous visit Follow up in the [facility] in 1-2 weeks; message sent to admin  Strict bleeding precautions Pelvic rest Return to MAU if symptoms worsen  Support given  Duane LopeJennifer I Ndrew Creason, NP 01/15/2016 9:57 PM

## 2016-01-31 ENCOUNTER — Encounter: Payer: Self-pay | Admitting: Obstetrics and Gynecology

## 2016-03-03 ENCOUNTER — Encounter: Payer: Self-pay | Admitting: Family Medicine

## 2016-10-04 ENCOUNTER — Encounter (HOSPITAL_COMMUNITY): Payer: Self-pay

## 2018-01-13 IMAGING — US US OB COMP LESS 14 WK
1 series · 15 of 28 positions shown · non-contrast
Comparison: Pelvic ultrasound July 22, 2010

CLINICAL DATA: Vaginal spotting for the past 2 days; pending beta
HCG determination. Suspected early pregnancy with estimated
gestational age by a last menstrual period of 6 weeks 2 days

EXAM:
OBSTETRIC <14 WK US AND TRANSVAGINAL OB US
TECHNIQUE: Both transabdominal and transvaginal ultrasound examinations were
performed for complete evaluation of the gestation as well as the
maternal uterus, adnexal regions, and pelvic cul-de-sac.
Transvaginal technique was performed to assess early pregnancy.

[Series 1: us ob comp less 14 wk · 15 of 61 slices shown]
[im 1/61]
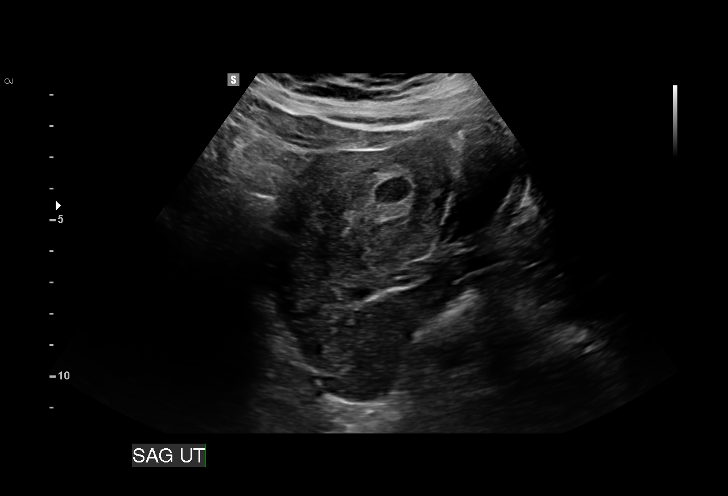
[im 5/61]
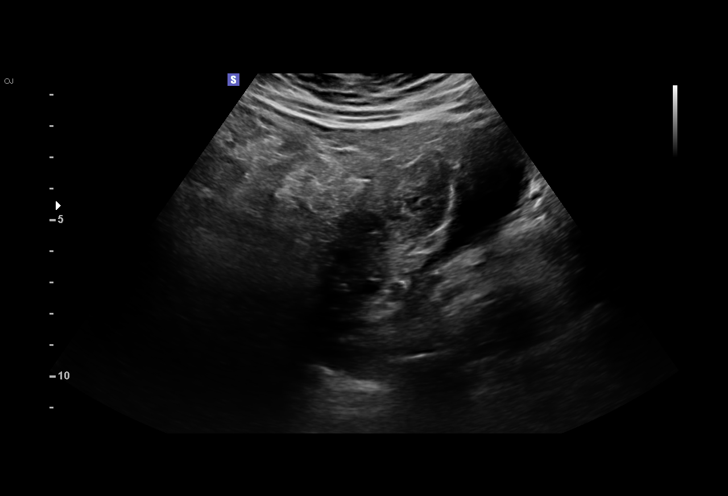
[im 9/61]
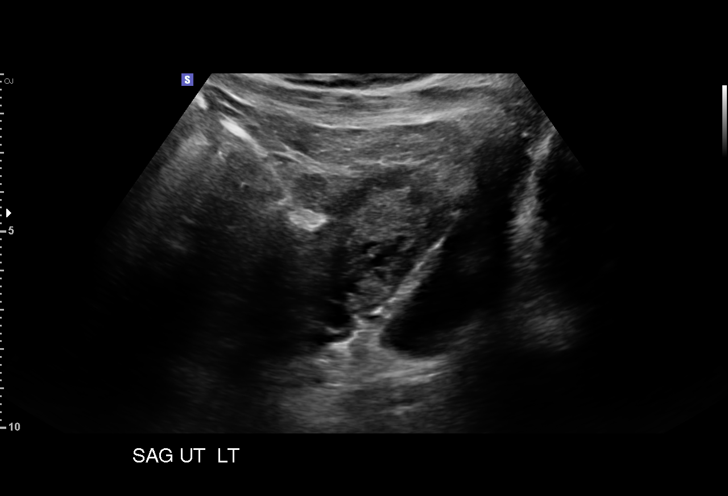
[im 14/61]
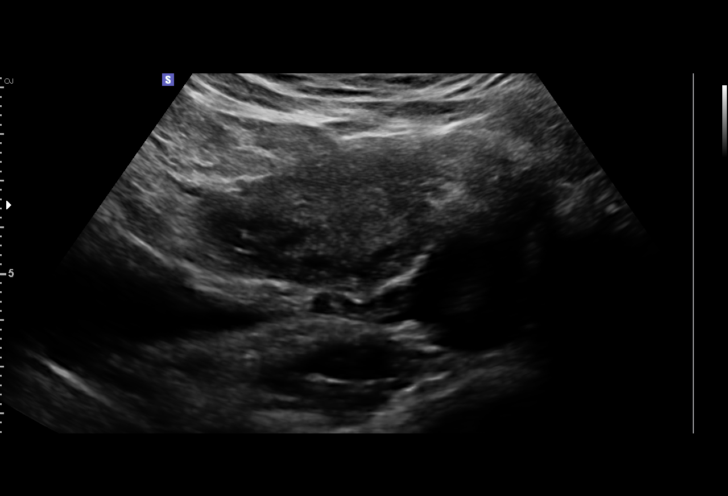
[im 18/61]
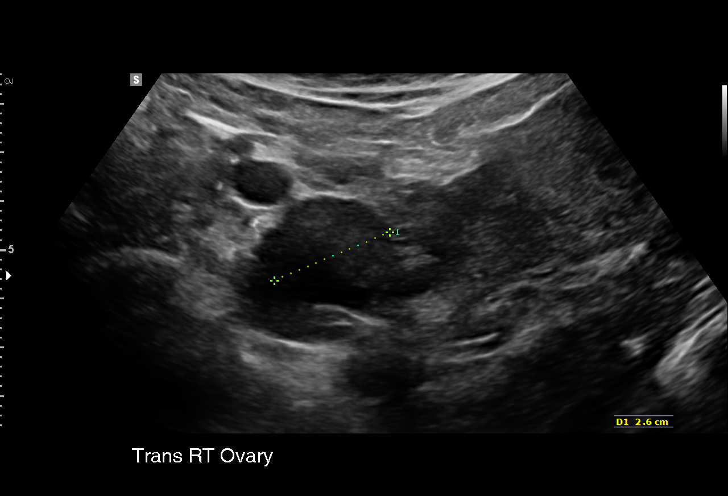
[im 23/61]
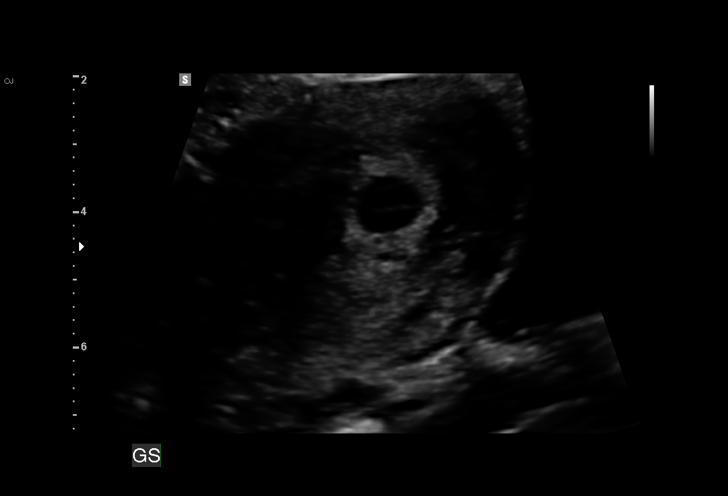
[im 27/61]
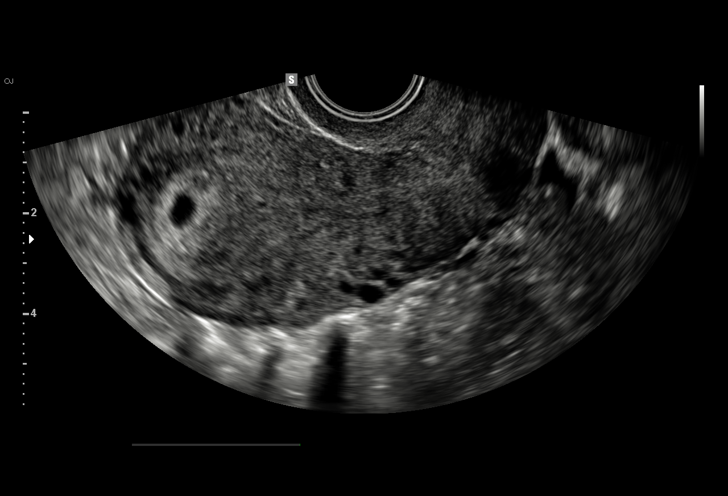
[im 32/61]
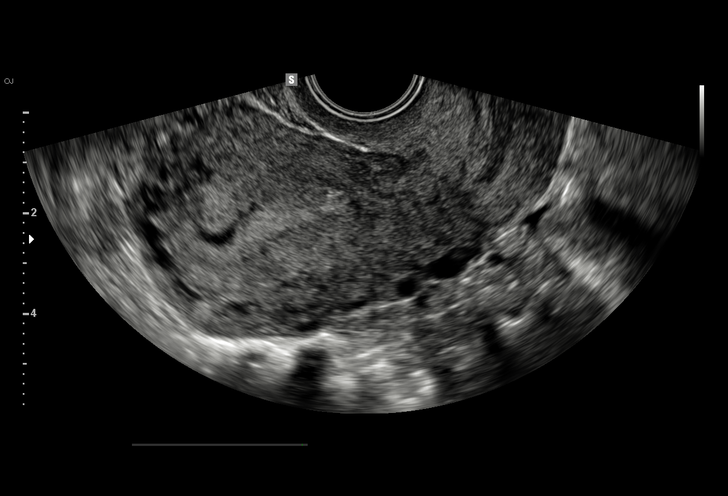
[im 34/61]
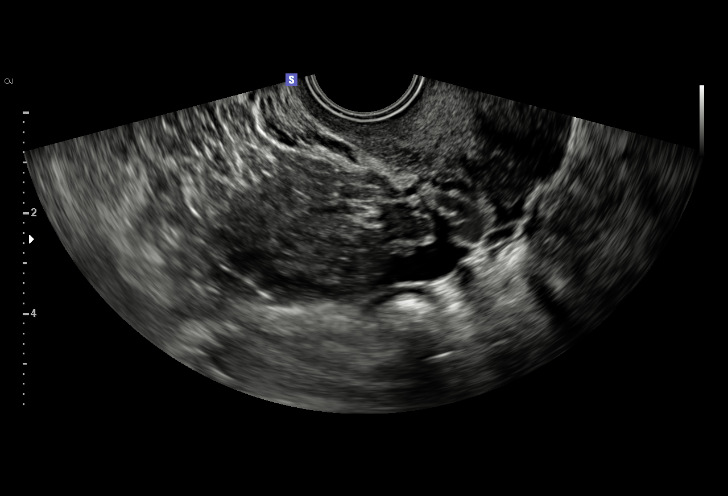
[im 38/61]
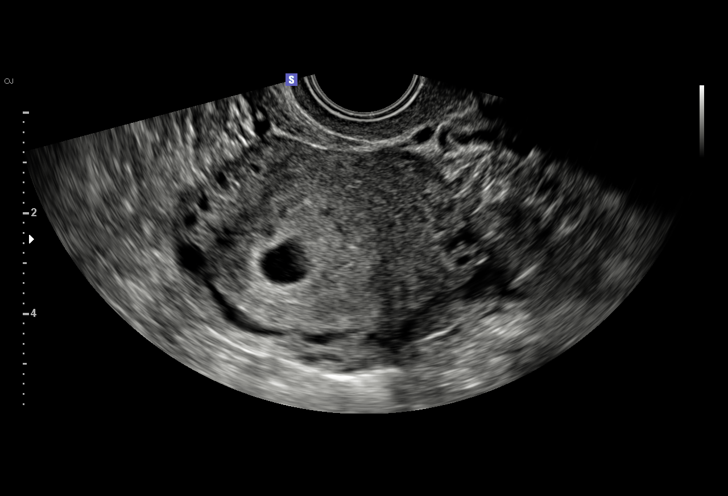
[im 43/61]
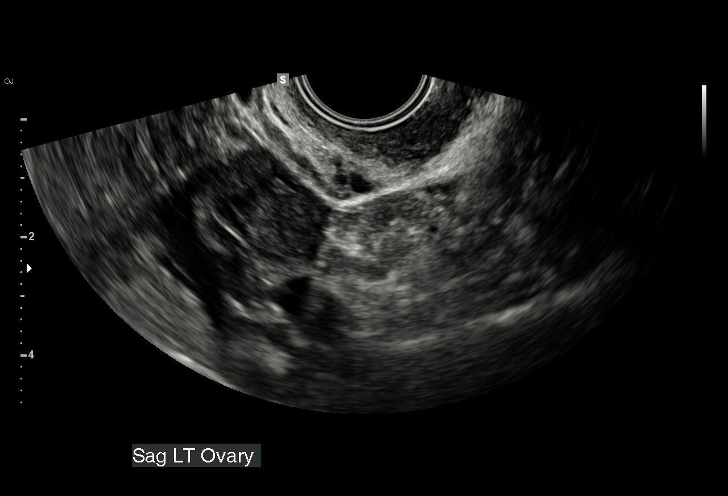
[im 47/61]
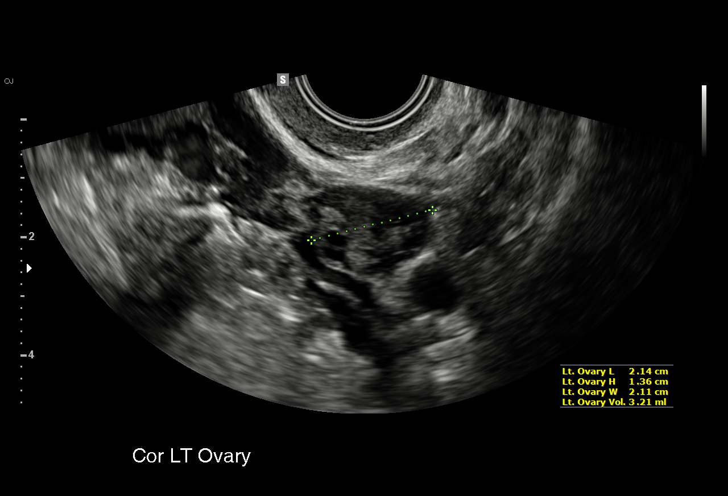
[im 52/61]
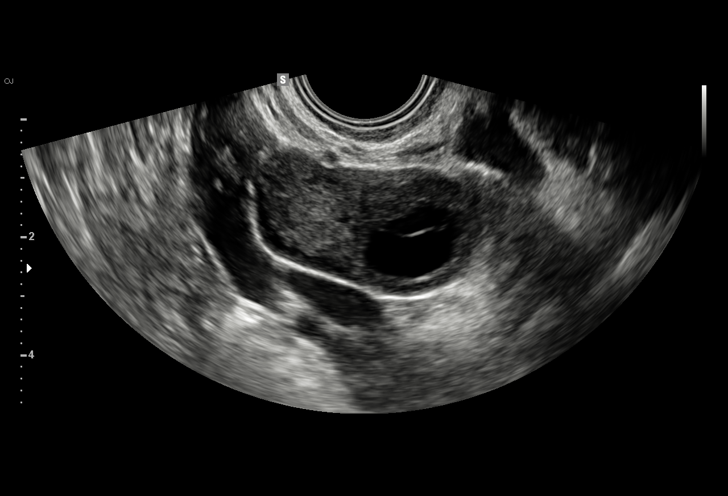
[im 56/61]
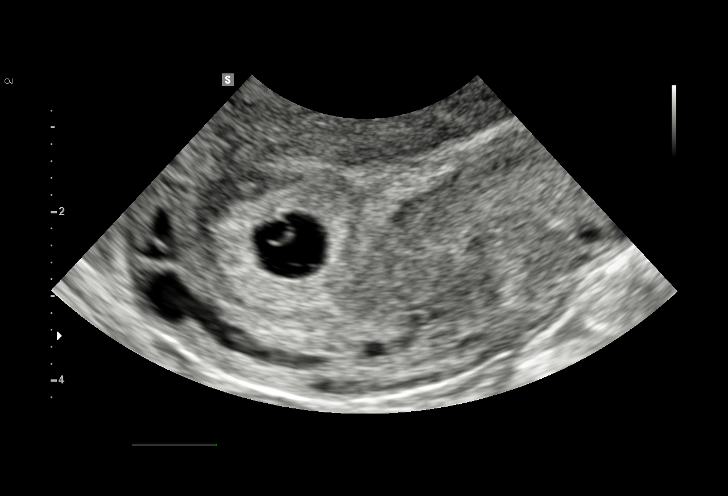
[im 61/61]
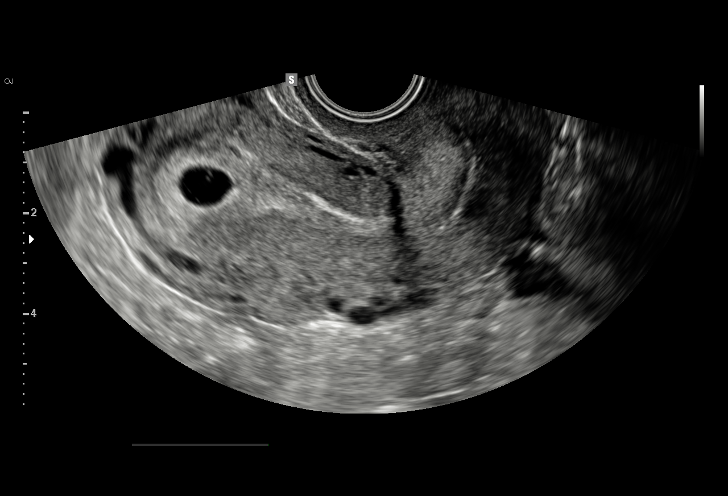

[15 of 28 positions shown; findings below may reference images not displayed]

FINDINGS: Intrauterine gestational sac: Present

Yolk sac:  Present

Embryo:  None visualize

Cardiac Activity: None visualize

Heart Rate: n/a  bpm

MSD: 0.87  cm   5 w   5  d

Subchorionic hemorrhage: A small subchorionic hemorrhage is observed
which is less than 1 cm in greatest dimension.

Maternal uterus/adnexae: The right ovary measures 4 x 2 x 2.3 cm.
The left ovary measures 2.1 x 1.4 x 2.1 cm.

There is no free pelvic fluid.
IMPRESSION: 1. There is an intrauterine gestational sac with a tiny yolk sac
observed. The mean sac diameter is 0.87 cm which would correspond to
a 5 week 5 day gestation. No fetal pole or cardiac activity is
observed. There is a small subchorionic hemorrhage. Follow-up
ultrasound and beta HCG determinations are recommended.
2. Normal ovaries and adnexal regions.

## 2018-02-22 IMAGING — US US OB COMP LESS 14 WK
1 series · 15 of 28 positions shown · non-contrast
Comparison: Ultrasound dated 11/30/2015

CLINICAL DATA: 35-year-old pregnant female with vaginal bleeding x4
days.

EXAM:
OBSTETRIC <14 WK ULTRASOUND
TECHNIQUE: Transabdominal ultrasound was performed for evaluation of the
gestation as well as the maternal uterus and adnexal regions.

[Series 1: us ob comp less 14 wk · 15 of 35 slices shown]
[im 1/35]
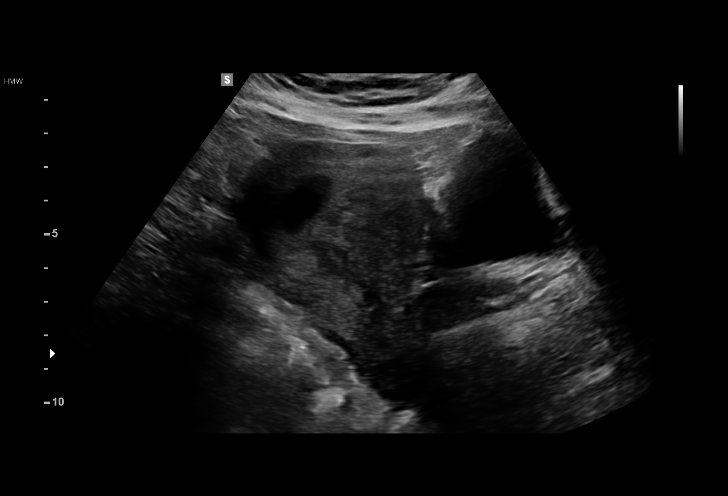
[im 3/35]
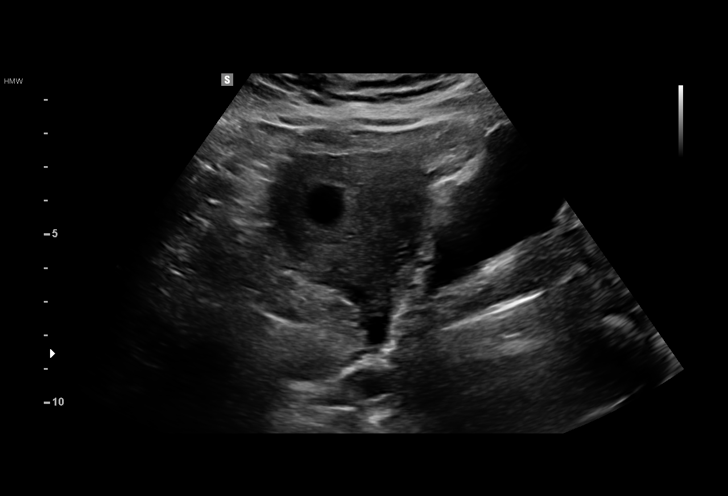
[im 6/35]
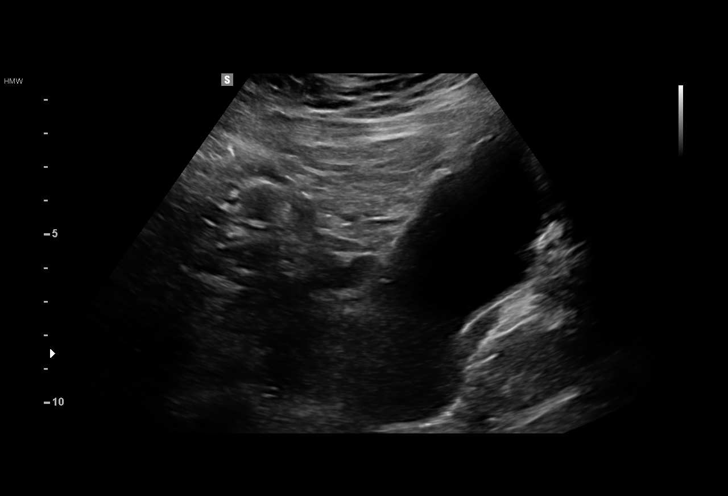
[im 8/35]
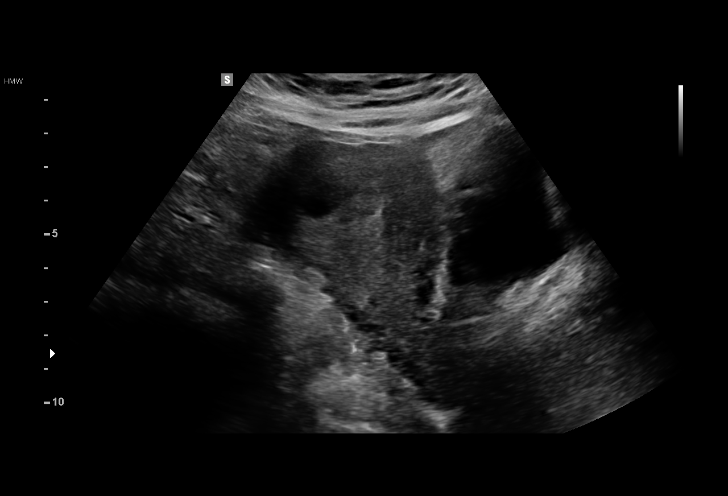
[im 11/35]
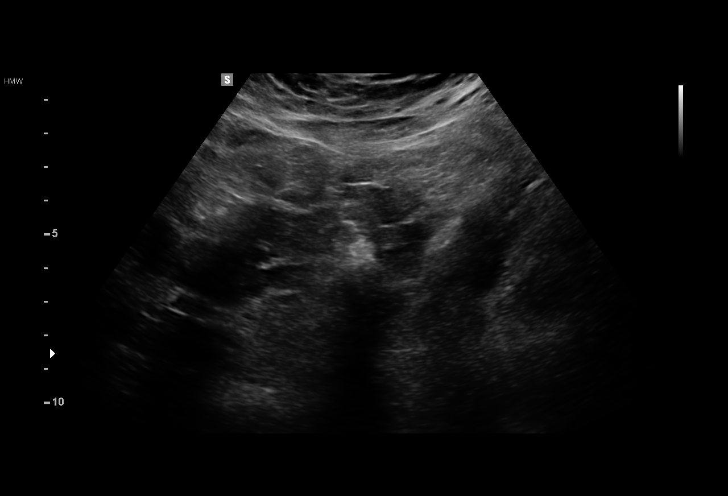
[im 13/35]
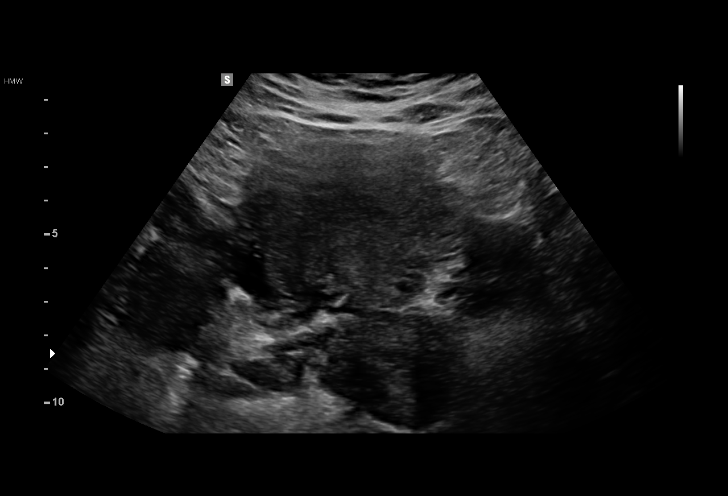
[im 16/35]
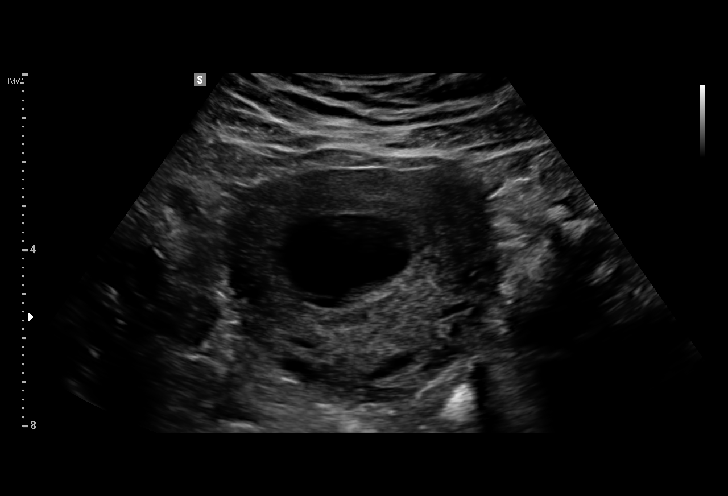
[im 18/35]
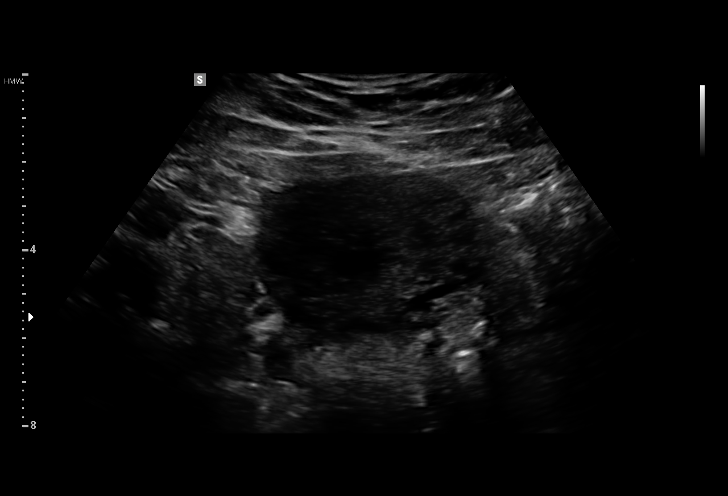
[im 19/35]
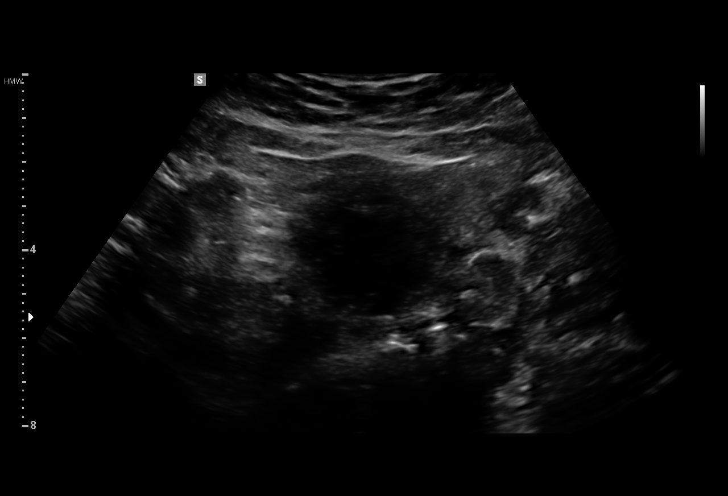
[im 22/35]
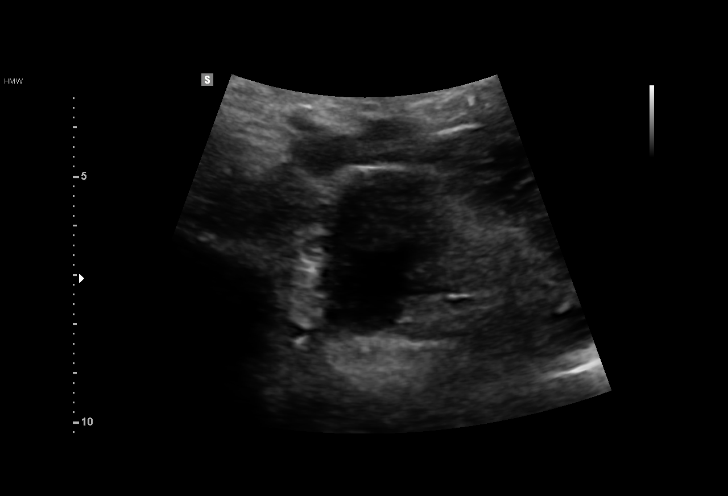
[im 24/35]
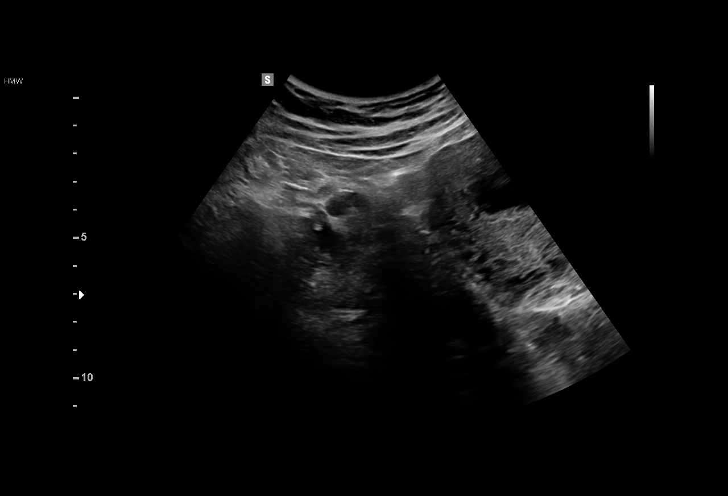
[im 27/35]
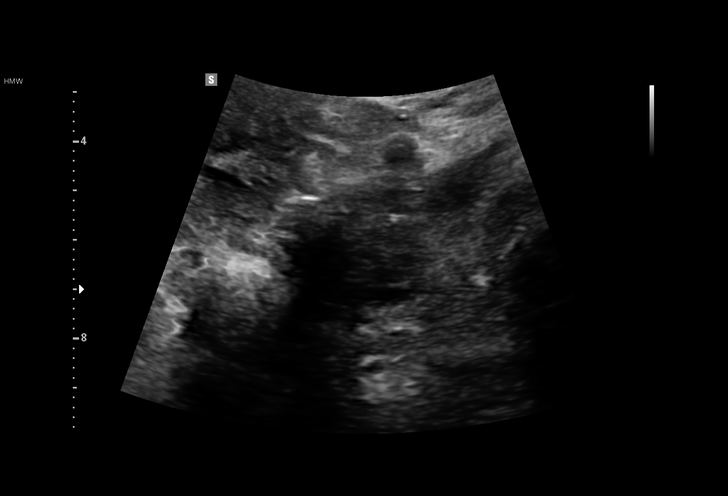
[im 29/35]
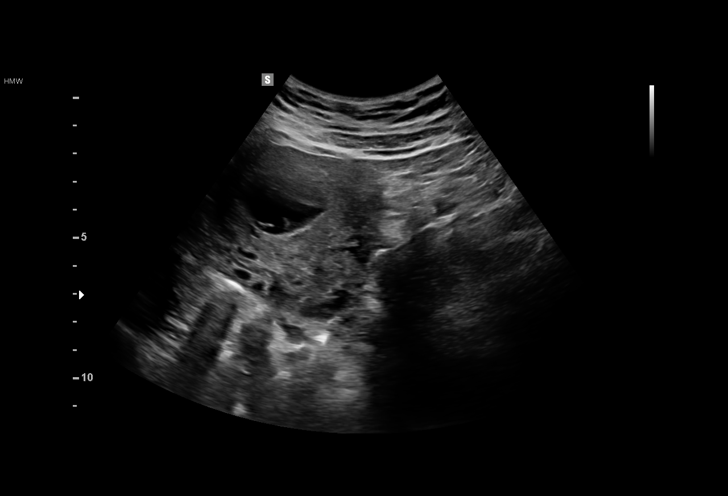
[im 32/35]
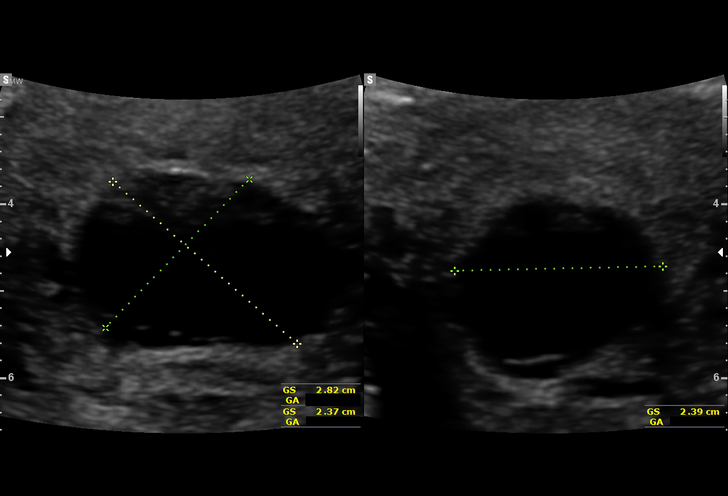
[im 35/35]
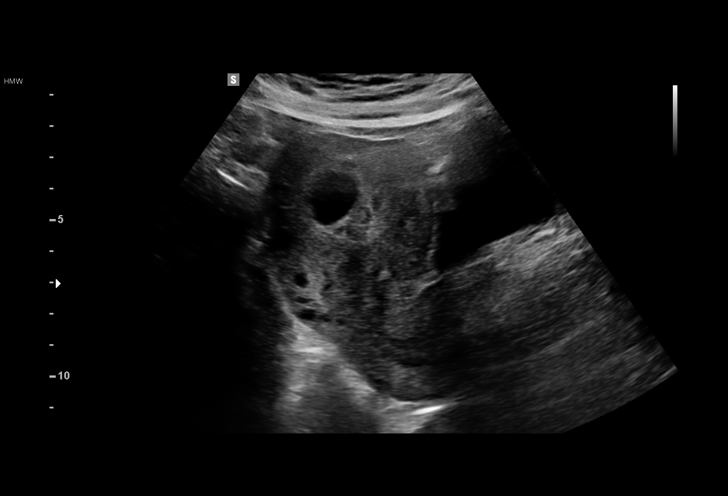

[15 of 28 positions shown; findings below may reference images not displayed]

FINDINGS: There is a cystic structure within the endometrial canal. No yolk
sac or fetal pole identified within this structure. This cystic
structure has a mean sac diameter of 25 mm which corresponds to an
estimated gestational age of 7 weeks, 4 days. The absence of fetal
pole or a yolk sac at this time is highly suspicious for a nonviable
pregnancy. This may represent a blighted ovum or failed pregnancy
seen on the previous ultrasound.

Subchorionic hemorrhage:  Small subchorionic tender

Maternal uterus/adnexae: The maternal ovaries appear unremarkable.
No free fluid.
IMPRESSION: Cystic structure within the endometrial canal as described
suspicious for failed pregnancy. Clinical correlation and follow-up
recommended.

## 2022-03-14 ENCOUNTER — Ambulatory Visit: Payer: Self-pay | Admitting: Nurse Practitioner
